# Patient Record
Sex: Male | Born: 1935 | Race: White | Hispanic: No | Marital: Married | Smoking: Never smoker
Health system: Southern US, Community
[De-identification: ages and names within clinical notes are randomized; demographics above are authoritative.]

## PROBLEM LIST (undated history)

## (undated) DIAGNOSIS — Z7901 Long term (current) use of anticoagulants: Secondary | ICD-10-CM

## (undated) DIAGNOSIS — I4891 Unspecified atrial fibrillation: Secondary | ICD-10-CM

## (undated) DIAGNOSIS — Z950 Presence of cardiac pacemaker: Secondary | ICD-10-CM

## (undated) DIAGNOSIS — I2510 Atherosclerotic heart disease of native coronary artery without angina pectoris: Secondary | ICD-10-CM

## (undated) DIAGNOSIS — C8590 Non-Hodgkin lymphoma, unspecified, unspecified site: Secondary | ICD-10-CM

## (undated) HISTORY — PX: TONSILLECTOMY: SUR1361

## (undated) HISTORY — DX: Non-Hodgkin lymphoma, unspecified, unspecified site: C85.90

## (undated) HISTORY — DX: Presence of cardiac pacemaker: Z95.0

## (undated) HISTORY — DX: Unspecified atrial fibrillation: I48.91

## (undated) HISTORY — PX: APPENDECTOMY: SHX54

## (undated) HISTORY — DX: Atherosclerotic heart disease of native coronary artery without angina pectoris: I25.10

## (undated) HISTORY — PX: INSERT / REPLACE / REMOVE PACEMAKER: SUR710

## (undated) HISTORY — PX: CARDIAC CATHETERIZATION: SHX172

## (undated) HISTORY — DX: Long term (current) use of anticoagulants: Z79.01

## (undated) HISTORY — PX: HERNIA REPAIR: SHX51

---

## 1999-08-24 ENCOUNTER — Other Ambulatory Visit: Admission: RE | Admit: 1999-08-24 | Discharge: 1999-08-24 | Payer: Self-pay

## 2001-01-14 ENCOUNTER — Ambulatory Visit (HOSPITAL_COMMUNITY): Admission: RE | Admit: 2001-01-14 | Discharge: 2001-01-14 | Payer: Self-pay | Admitting: Gastroenterology

## 2001-01-14 ENCOUNTER — Encounter (INDEPENDENT_AMBULATORY_CARE_PROVIDER_SITE_OTHER): Payer: Self-pay | Admitting: Specialist

## 2002-06-29 ENCOUNTER — Ambulatory Visit (HOSPITAL_COMMUNITY): Admission: RE | Admit: 2002-06-29 | Discharge: 2002-06-29 | Payer: Self-pay | Admitting: Cardiology

## 2002-06-29 ENCOUNTER — Encounter: Payer: Self-pay | Admitting: Cardiology

## 2002-06-30 ENCOUNTER — Ambulatory Visit (HOSPITAL_COMMUNITY): Admission: RE | Admit: 2002-06-30 | Discharge: 2002-06-30 | Payer: Self-pay | Admitting: Cardiology

## 2003-04-19 ENCOUNTER — Ambulatory Visit (HOSPITAL_COMMUNITY): Admission: RE | Admit: 2003-04-19 | Discharge: 2003-04-19 | Payer: Self-pay | Admitting: Cardiology

## 2003-12-26 ENCOUNTER — Emergency Department (HOSPITAL_COMMUNITY): Admission: EM | Admit: 2003-12-26 | Discharge: 2003-12-26 | Payer: Self-pay | Admitting: Emergency Medicine

## 2006-05-22 ENCOUNTER — Ambulatory Visit: Payer: Self-pay | Admitting: Internal Medicine

## 2006-05-23 ENCOUNTER — Ambulatory Visit: Payer: Self-pay | Admitting: Internal Medicine

## 2006-05-23 ENCOUNTER — Inpatient Hospital Stay (HOSPITAL_COMMUNITY): Admission: RE | Admit: 2006-05-23 | Discharge: 2006-05-25 | Payer: Self-pay | Admitting: Internal Medicine

## 2006-05-24 ENCOUNTER — Encounter: Payer: Self-pay | Admitting: Internal Medicine

## 2006-05-27 ENCOUNTER — Ambulatory Visit: Payer: Self-pay | Admitting: Internal Medicine

## 2006-05-27 ENCOUNTER — Inpatient Hospital Stay (HOSPITAL_COMMUNITY): Admission: EM | Admit: 2006-05-27 | Discharge: 2006-05-30 | Payer: Self-pay | Admitting: Emergency Medicine

## 2006-05-28 ENCOUNTER — Encounter (INDEPENDENT_AMBULATORY_CARE_PROVIDER_SITE_OTHER): Payer: Self-pay | Admitting: Cardiology

## 2006-06-12 ENCOUNTER — Ambulatory Visit: Payer: Self-pay

## 2008-03-31 IMAGING — CR DG CHEST 2V
2 series · 2 of 2 positions shown · non-contrast
Comparison: none

CLINICAL DATA: Pre-pacer.
 CHEST - 2 VIEW:
 No comparison.

[view not recorded (1 of 2)]
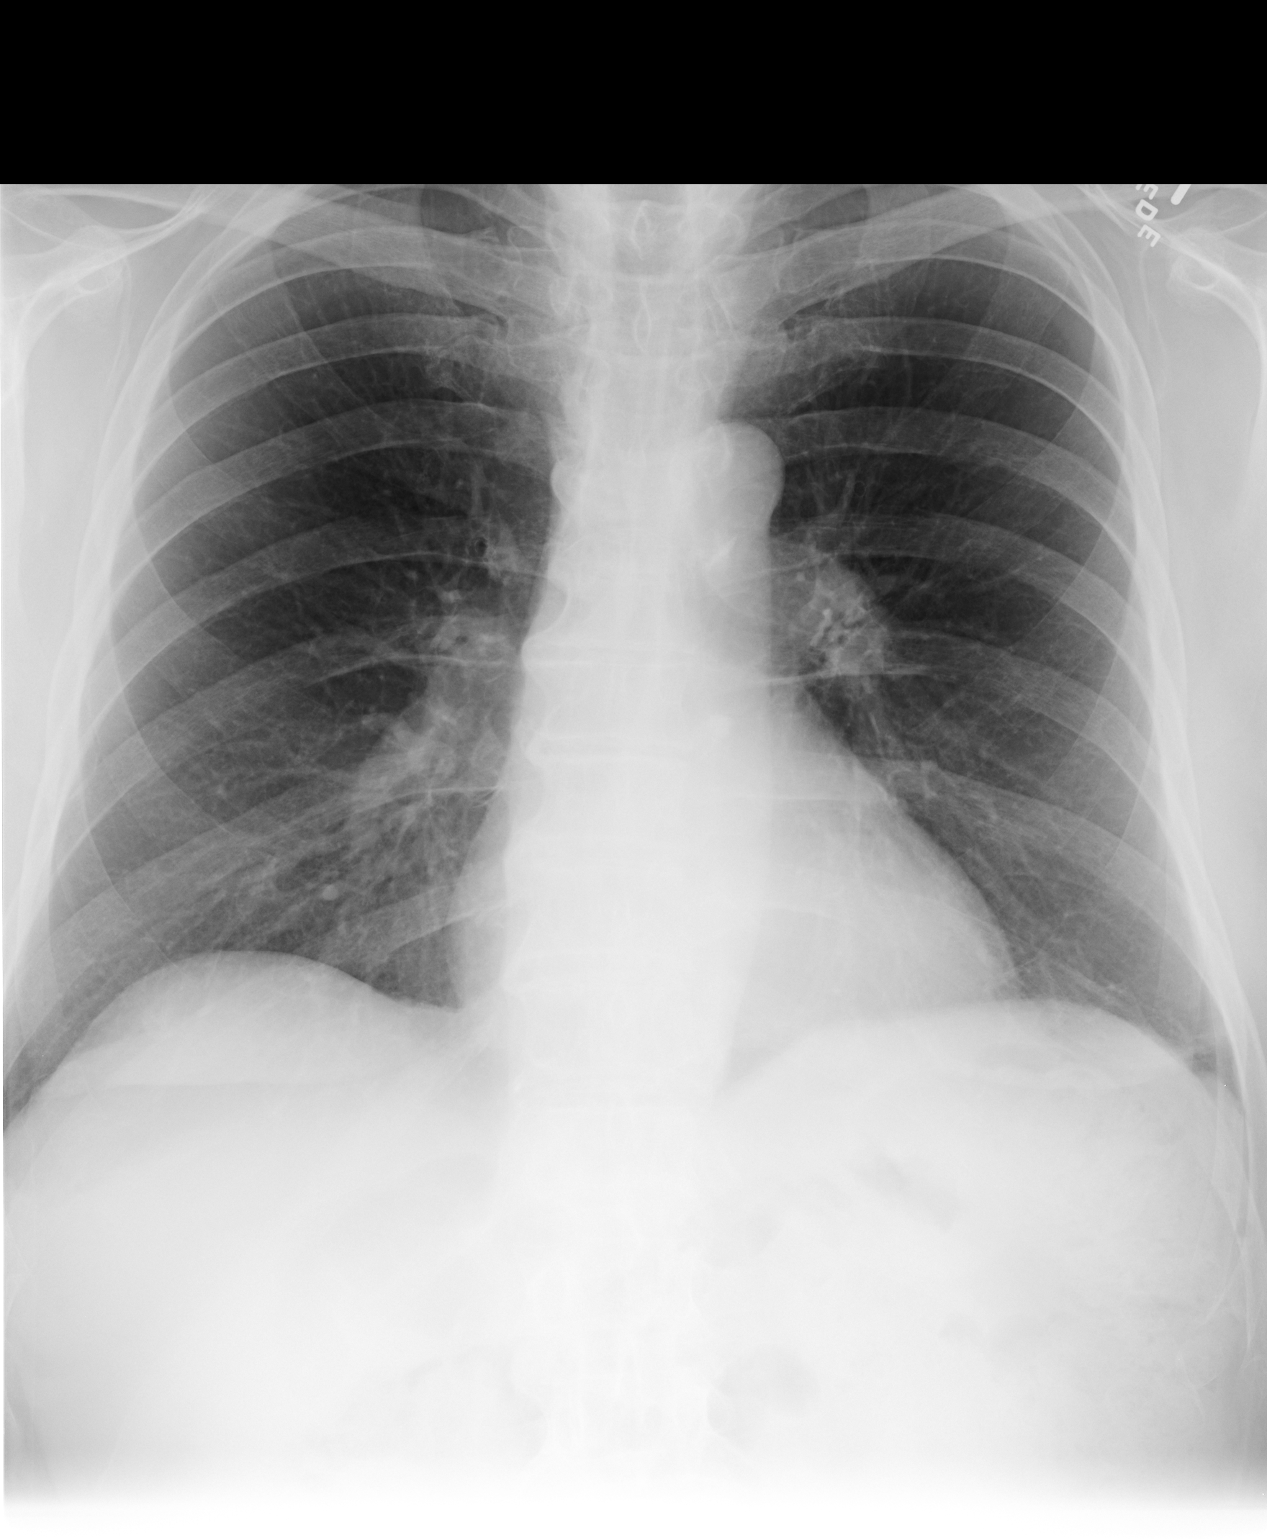

[view not recorded (2 of 2)]
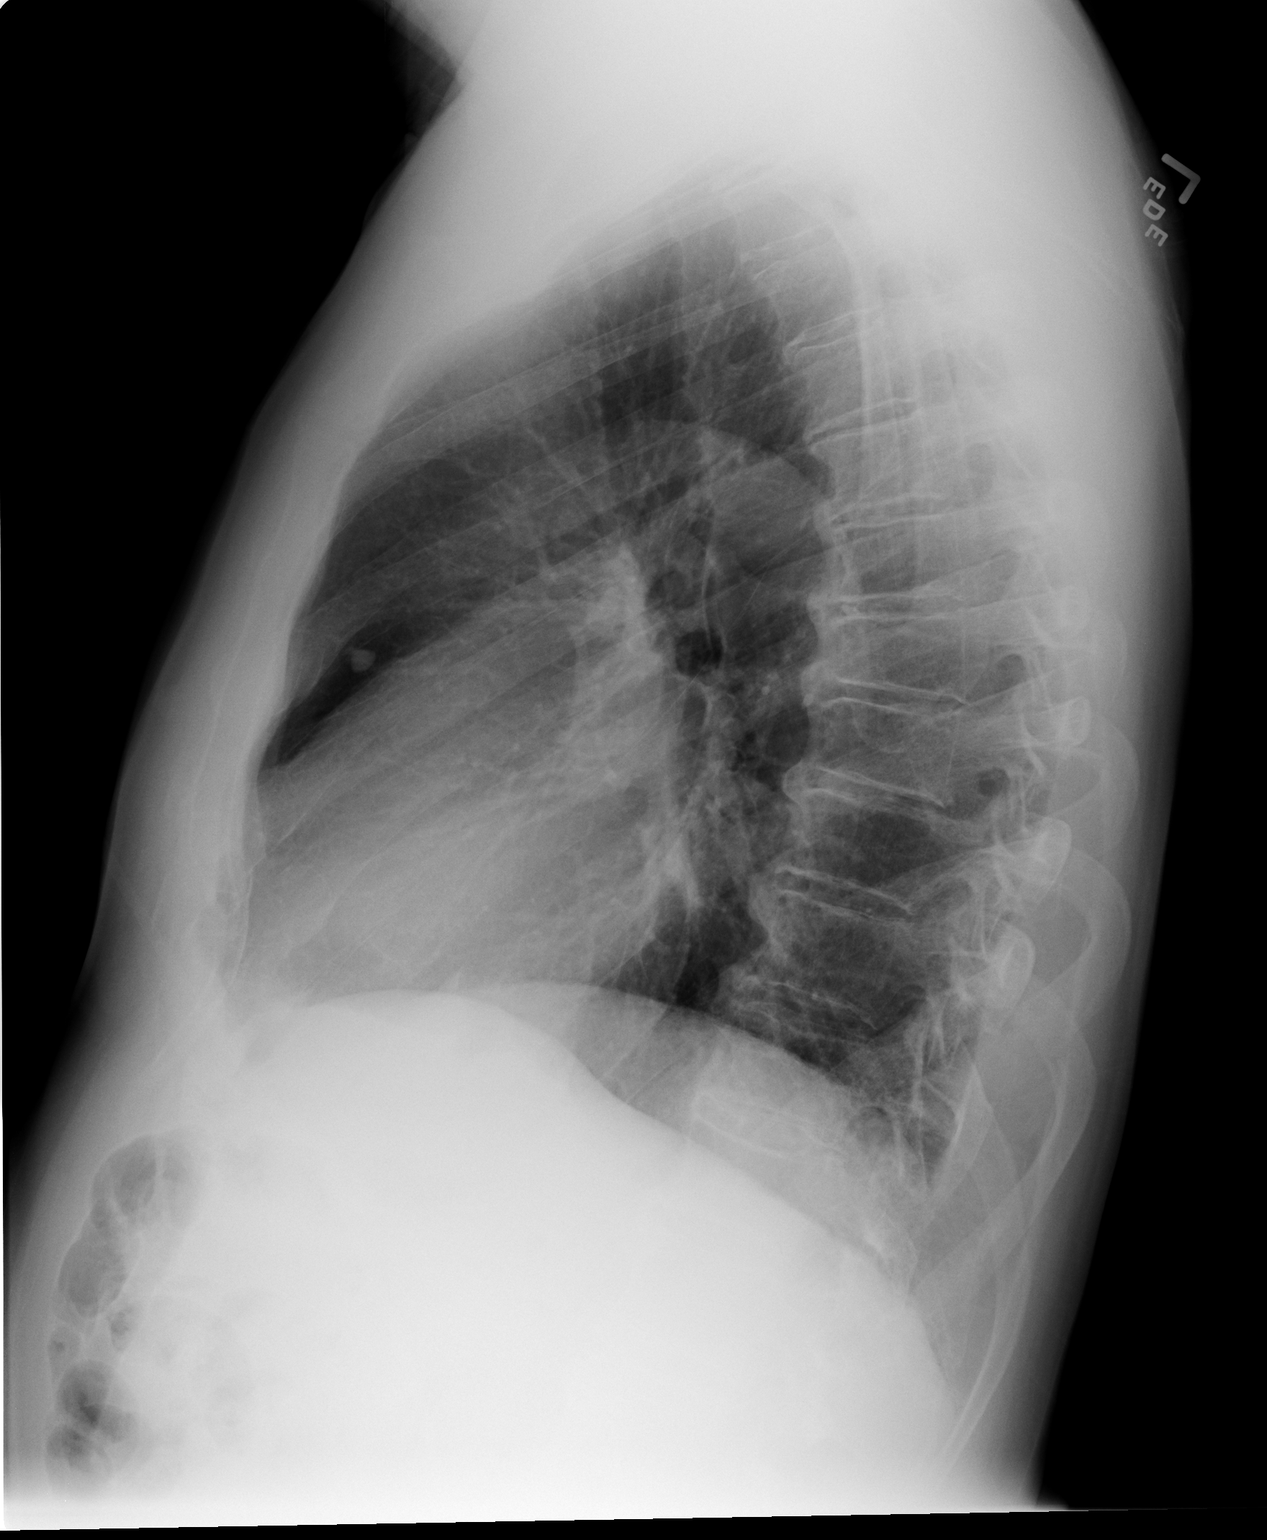

[2 of 2 positions shown; findings below may reference images not displayed]

FINDINGS: The heart size is normal.   There is no heart failure, infiltrate, or effusion.  A calcified granuloma is noted in the right middle lobe.
IMPRESSION: No acute abnormality.

## 2008-09-13 ENCOUNTER — Emergency Department (HOSPITAL_COMMUNITY): Admission: EM | Admit: 2008-09-13 | Discharge: 2008-09-13 | Payer: Self-pay | Admitting: Emergency Medicine

## 2010-10-19 LAB — GLUCOSE, CAPILLARY: Glucose-Capillary: 94 mg/dL (ref 70–99)

## 2010-11-24 NOTE — Cardiovascular Report (Signed)
NAME:  Mark Robinson, Mark Robinson NO.:  [id number]   MEDICAL RECORD NO.:  [id number]                   PATIENT TYPE:  OIB   LOCATION:  2864                                 FACILITY:  [facility]   PHYSICIAN:  W. Ashley Royalty., M.D.         DATE OF BIRTH:  02-14-1930   DATE OF PROCEDURE:  04/19/2003  DATE OF DISCHARGE:                              CARDIAC CATHETERIZATION   HISTORY:  A 77 year old male who has a prior history of atrial fibrillation.  He was taken off of quinidine and placed on Tambocor and developed diffuse  anterior T-wave inversions compatible with ischemia.  He had a history of  irregularity several years ago and was not symptomatic but because of the  new high risk EKG pattern he is brought in at this time for catheterization  to exclude significant proximal left main or left anterior descending  disease.   PROCEDURE:  Left heart catheterization with coronary angiograms and left  ventriculogram.   COMMENTS ABOUT PROCEDURE:  The patient tolerated the procedure well without  difficulty.  The right femoral artery was entered using a single anterior  needle wall stick.  At the end of the procedure good hemostasis and  peripheral pulses were noted.   HEMODYNAMIC DATA:  1. Aorta post contrast 107/53.  2. LV post contrast 107/9-14.   ANGIOGRAPHIC DATA:  1. Left ventriculogram:  Performed in the 30 degree RAO projection.  The     aortic valve was normal.  The mitral valve was normal.  The left     ventricle appears normal in size.  The estimated ejection fraction is 50%     and is lower limits of normal.  Coronary angiograms:  Arise and     distribute normally.  There is calcification noted in the proximal left     anterior descending as well as the right coronary ostium.  2. Left main coronary artery is normal.  3. Left anterior descending:  Calcified with diffuse mild to moderate     narrowing in the proximal vessel and mid vessel.  Two  diagonal branches     arise which contain moderate irregularity.  The distal LAD is tortuous     and somewhat small.  There is calcification with diffuse narrowing in the     mid portion estimated mild to moderate degree.  There is some haziness     just after the diagonal branch which may be due to calcification and     tortuosity.  The stenosis is less than 20-30% at this site.  4. Circumflex coronary artery:  Single marginal artery.  Contains scattered     irregularities but no significant stenoses are noted.  5. Right coronary artery:  Dominant vessel with posterior descending artery     and posterolateral branch.  There is 30-40% narrowing in the distal right     coronary artery involving the ostium in the  posterior descending artery.   IMPRESSION:  1. Mild coronary artery disease with no significant focal obstruction or     stenoses noted.  2. Lower limits of normal ventricular function.    RECOMMENDATIONS:  The patient has a severely abnormal EKG with anterior T-  wave inversions.  Consider stopping Tambocor and see if EKG changes revert.  He will also need to have exercise testing on the Tambocor also for  suppression of atrial fibrillation.                                               Darden Palmer., M.D.    WST/MEDQ  D:  04/19/2003  T:  04/19/2003  Job:  [id number]   cc:   Geoffry Paradise, M.D.  [address]  [place]  [place] [address]  Fax: [phone]

## 2010-11-24 NOTE — Discharge Summary (Signed)
NAME:  MENG, WINTERTON NO.:  [id number]   MEDICAL RECORD NO.:  [id number]          PATIENT TYPE:  INP   LOCATION:  3701                         FACILITY:  [facility]   PHYSICIAN:  Doylene Canning. Ladona Ridgel, MD    DATE OF BIRTH:  01-12-1930   DATE OF ADMISSION:  05/27/2006  DATE OF DISCHARGE:  05/30/2006                               DISCHARGE SUMMARY   PRIMARY DIAGNOSES:  1. Admitted with chest discomfort with associated neck pain.  2. The patient is status post recent implant of permanent pacemaker      for severe bradycardia.  Pacing threshold of the right ventricular      lead is elevated, is a possible post implant pericardial effusion      secondary to microperforation.  3. Echocardiogram May 28, 2006:  Small circumferential      pericardial effusion, ejection fraction 60%, no left ventricular      wall motion abnormalities.  4. Cardiac enzymes negative this admission.  Troponin-I studies were      0.02 and 0.01.  5. The patient is discharging day one status post both atrial and      ventricular lead revisions (probable microperforation of original      right ventricular lead.)  6. Paroxysmal atrial fibrillation/chronic Coumadin.  This been on hold      for 72 hours during this admission and then will be restarted on      the day of discharge.   SECONDARY DIAGNOSES:  1. History of paroxysmal atrial fibrillation.  2. Sinus node dysfunction with severe bradycardia status post      pacemaker implantation.   PROCEDURE:  May 29, 2006 both atrial and right ventricular lead  revisions in pacemaker for probable microperforation probably at the  right ventricular lead.  The patient has had no immediate complications.  Once again Coumadin has been held this admission.   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  He has a history of  paroxysmal atrial fibrillation, severe bradycardia and sinus node  dysfunction.  He had a permanent pacemaker implanted  several days ago.   On the morning of May 27, 2006 he felt sudden onset of chest  discomfort associated with pain radiating to the neck.  He said the pain  is made worse by taking a deep breath.  Also made worse by lying flat,  but better by standing.  Otherwise it is not positional.  There is no  radiation to the left arm.  There is no diaphoresis or nausea.  He did  vomit.  The pain waxes and wanes.  He presented to the emergency room  for additional evaluation.  Impression is his chest pain has both  pleuritic as well as atypical features for angina.  The patient will be  admitted through the emergency room.  Serial cardiac enzymes will be  measured.  2-D echocardiogram will be taken on the morning of May 28, 2006 to compare it to an echo done several days ago after  implantation of pacemaker.  His Coumadin will be held.  He will  be  treated with NSAIDs for pain.  If the pain resolves and enzymes are  negative and the pacemaker checks out well, he will be able to  discharge.  If there is evidence of pericardial effusion secondary to  microperforation or changes in the pacing and sensing parameters, then  atrial and ventricular lead revisions are recommended.   HOSPITAL COURSE:  Mr. Meas presented with chest discomfort to the  emergency room.  His troponin-I studies were cycled; 0.02 then 0.01.  They were essentially negative.  The patient had 2-D echocardiogram on  May 28, 2006 which showed small circumferential pericardial  effusion.  His chest pain waxed and waned during this time.  His  Coumadin has been held this admission.  Interrogation of the device  showed that the right ventricular lead threshold was high.  He was  scheduled for procedure May 29, 2006.  This was done by Dr. Lewayne Bunting.  Both the right atrial and right ventricular leads were  repositioned.  The patient exhibit atrial flutter during the procedure.  The patient discharging May 30, 2006 postprocedure day #1 with  chest pain resolution and restarting Coumadin on that day May 30, 2006.  He goes home on the following medications which have not been  changed from admission.  1. Flecainide 150 mg twice daily.  2. Coumadin as before the admission to the hospital on May 27, 2006.  3. Motrin 400 mg q.8h. as needed.  4. Ambien 10 mg at bedtime daily.   He is to follow-up with Dr. Viann Fish for protime Tuesday June 02, 2006.  He will follow-up with Dr. Lewayne Bunting in 2 weeks.  Dr.  Lubertha Basque office will call with that appointment.  In addition, the  patient discharged on Keflex 500 mg 4 times a day x5 days as this is a  repeat procedure at the pacemaker pocket.   LABORATORY STUDIES THIS ADMISSION:  On admission complete blood count:  White cells 5.5, hemoglobin 14.2, hematocrit 42.2 and platelets are 209.  The D-dimer is 0.39.  Serum electrolytes:  Sodium 142, potassium 3.8,  chloride 105, carbonate 28, glucose of 96, BUN 16, creatinine 0.8.  Once  again troponin-I studies are 0.02 then 0.01.      Maple Mirza, PA      Doylene Canning. Ladona Ridgel, MD  Electronically Signed    GM/MEDQ  D:  07/31/2006  T:  07/31/2006  Job:  [id number]   cc:   Doylene Canning. Ladona Ridgel, MD  Georga Hacking, M.D.

## 2010-11-24 NOTE — Assessment & Plan Note (Signed)
[facility]                         ELECTROPHYSIOLOGY OFFICE NOTE   NAME:Nakagawa, JARAY BOLIVER                         MRN:          [id number]  DATE:06/12/2006                            DOB:          [birth year]    Mr. Joslyn seen in the device clinic today, June 12, 2006 for postop  followup of his revised Medtronic EnRhythm dual-chamber pacemaker  procedure done November 15 by Dr. Graciela Husbands.   Upon exam, the site looks good without redness or swelling. Steri-Strips  were removed without incident. Upon interrogation, battery voltage is  3.11 volts. He is dependent to a rate of 30 in the ventricle at this  point. In the atrium, intrinsic amplitude is 3.4 millivolts with an  impedance of 472 ohms and threshold of 1 volt at 0.4 msec. In the right  ventricle, intrinsic amplitude as stated he is dependent to 30,  impedance is 440 ohms with a threshold of 1.5 volts at 0.4 msec.  Threshold was 1.2 at 0.5 msec at implant. No programming changes were  made at this time. Mr. Leffel will be seen for followup in April and then  return to Dr. Donnie Aho for subsequent followup.      Cleatrice Burke, RN  Electronically Signed      Duke Salvia, MD, [facility]  Electronically Signed   CF/MedQ  DD: 06/12/2006  DT: 06/12/2006  Job #: [id number]

## 2010-11-24 NOTE — H&P (Signed)
NAME:  Mark Robinson, WADDINGTON NO.:  [id number]   MEDICAL RECORD NO.:  [id number]          PATIENT TYPE:  INP   LOCATION:  3729                         FACILITY:  [facility]   PHYSICIAN:  Doylene Canning. Ladona Ridgel, MD    DATE OF BIRTH:  1930/06/07   DATE OF ADMISSION:  05/27/2006  DATE OF DISCHARGE:                                HISTORY & PHYSICAL   CHIEF COMPLAINT:  Chest pain.   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  He  was in his usual state of health, feeling well until today when he noted  sudden onset of chest discomfort associated with pain in the neck.  The  patient states that this pain was made worse by taking a deep breath and  also made worse by lying flat, better by standing.  It was not otherwise  positional.  There was no radiation to left arm.  There was no diaphoresis  or nausea.  He did vomit.  The pain waxed and waned, and he presented to the  emergency department for additional evaluation.   PAST MEDICAL HISTORY:  Is as noted above.  He has a history of chronic  Coumadin therapy.  He is on multiple vitamins.   SOCIAL HISTORY:  The patient is married.  He is a retired Associate Professor.  He  denies tobacco or ethanol abuse.   FAMILY HISTORY:  Was negative for premature coronary disease, otherwise  noncontributory.   REVIEW OF SYSTEMS:  Is negative except as noted in the HPI.  Please note all  systems reviewed and found to be negative.   PHYSICAL EXAMINATION:  He is a pleasant, well-appearing, 77 year old man in  no acute distress.  Blood pressure was 135/70.  The pulse was 60 and  regular.  The respirations were 18, temperature was 98.  HEENT:  Normocephalic.  ENT was normal.  NECK EXAM:  Revealed no jugular  distension.  There were no canon A-waves.  Kussmaul's sign was not present.  The carotids were 2+ and symmetric.  The  thyroid was not appreciably enlarged.  LUNGS:  Clear bilaterally to auscultation.  There were no wheezes, rales or  rhonchi.  There was no increased work of breathing.  CARDIOVASCULAR EXAM:  Revealed a regular rate and rhythm with normal S1-S2.  I do not appreciate a friction rub.  PMI was not enlarged nor was it  laterally displaced.  ABDOMINAL:  Was soft and nontender.  There was no organomegaly.  The bowel  sounds were present.  There was no rebound or guarding.  EXTREMITIES:  Demonstrated no cyanosis, clubbing or edema.  NEUROLOGIC EXAM:  Was alert and oriented x3 with cranial nerves intact.  Strength was 5/5 and symmetric.  EXTREMITIES:  Demonstrate no cyanosis, clubbing or edema.  SKIN EXAM:  Was normal.   Bedside portal echo was performed by myself demonstrating a small/trivial  pericardial  effusion.   IMPRESSION:  1. Chest pain with both pleuritic as well as atypical features for angina.  2. Paroxysmal atrial fibrillation.  3. Sinus bradycardia.  4. Status post pacemaker insertion.  5. Chronic flecainide therapy.  6. Chronic Coumadin therapy.   DISCUSSION:  I have recommended that the patient be admitted to the  hospital.  Will obtain serial cardiac enzymes.  Will repeat his 2-D echo in  the morning and compare it to his echo that was done several days ago after  his pacemaker implantation which was done at that time for nausea and  diaphoresis.  Will check his PT/INR and hold his Coumadin.  Will treat him  with NSAIDs for pain.  If his pain is resolved and his enzymes and negative  and his pacemaker checks out okay, then we will allow him early discharge.  If there is evidence that he has pericardial effusion secondary to  microperforation based on enlarging effusion or based on changes in his  pacing  and sensing parameters, then pericardiocentesis would  be  recommended, along with RV and right atrial lead revisions.      Doylene Canning. Ladona Ridgel, MD  Electronically Signed     GWT/MEDQ  D:  05/27/2006  T:  05/28/2006  Job:  [id number]   cc:   Georga Hacking, M.D.

## 2010-11-24 NOTE — Assessment & Plan Note (Signed)
[facility]                           ELECTROPHYSIOLOGY OFFICE NOTE   NAME:Guerrero, JONPAUL LUMM                         MRN:          [id number]  DATE:05/22/2006                            DOB:          January 03, 1930    HISTORY OF PRESENT ILLNESS:  Mr. Radu is re-referred today by Dr. Viann Fish for evaluation of tachy-brady syndrome and consideration for  permanent pacemaker insertion.  The patient is a very pleasant 77 year old  male with a history of paroxysmal atrial fibrillation and sick sinus  syndrome, for which he has been quite symptomatic recently.  He also has a  history of lymphoma, for which he has received chemotherapy.  The patient  has had fairly good control of his A-fib with flecainide, but has  unfortunately developed bradycardiac with resting heart rates in the 40s and  even into the 30s at night-time.  When he goes out of rhythm, he feels  fatigued and, at times when he is in rhythm, he feels fatigued, thought  secondary to his severe bradycardia.  In the last few weeks, he has been  taking flecainide on a p.r.n. basis because he has, at the same time, had  both A-fib, as well as sinus bradycardia, both of which have been  symptomatic.  The patient has had no frank syncope.   His additional past medical history is as outlined in the HPI.  Also, he has  a history of inguinal hernia repair, has a history of catheterization with  preserved LV function and no coronary disease.   SOCIAL HISTORY:  The patient is a retired Associate Professor.  He denies tobacco  use.  He rarely drinks more than two alcoholic beverages in a day.   FAMILY HISTORY:  Noncontributory.   REVIEW OF SYSTEMS:  Negative, except as noted on the HPI.   PHYSICAL EXAMINATION:  GENERAL:  He is a pleasant, well-appearing, 76-year-  old, in no acute distress.  VITAL SIGNS:  The blood pressure today was 132/74, the pulse 59 and regular,  the respirations were 18, the weight  was 197 pounds.  HEENT EXAM:  Normal.  NECK:  The neck revealed no jugular venous distention.  There is no  thyromegaly.  Trachea is midline.  The carotids are 2+ and symmetric.  LUNGS:  The lungs are clear bilaterally to auscultation.  There are no  wheezes, rales or rhonchi.  There is no increased work of breathing.  CARDIOVASCULAR EXAM:  Showed a regular rate and rhythm with normal S1 and  S2.  There are no murmurs, rubs or gallops appreciated.  The PMI was not  enlarged, nor was it laterally displaced.  ABDOMINAL EXAM:  Soft, nontender, nondistended.  There is no organomegaly.  Bowel sounds are present.  There is no rebound or guarding.  EXTREMITIES:  Demonstrated no cyanosis, clubbing or edema.  The pulses are  2+ and symmetric.  SKIN EXAM:  Normal.  MUSCULOSKELETAL EXAM:  Normal.  NEUROLOGIC EXAM:  Alert and oriented times two with cranial nerves intact.  Strength is 5/5 and symmetric.   The EKG demonstrates sinus  bradycardia with first-degree AV block, which is  quite marked.   IMPRESSION:  1. Symptomatic tachy-brady syndrome.  2. Paroxysmal atrial fibrillation.  3. Chronic Coumadin therapy.   DISCUSSION:  Overall, Mr. Freeland is stable, but he clearly has symptomatic  tachy-brady.  For this reason, I have recommended permanent pacemaker  insertion.  The risks and benefits, goals and expectations of the procedure  have been discussed with him and he would like to proceed at the earliest  possible convenient time.  This was scheduled in that regard.     Doylene Canning. Ladona Ridgel, MD  Electronically Signed    GWT/MedQ  DD: 05/22/2006  DT: 05/22/2006  Job #: [id number]   cc:   Geoffry Paradise, M.D.

## 2010-11-24 NOTE — Discharge Summary (Signed)
NAME:  Mark Robinson, Mark Robinson NO.:  [id number]   MEDICAL RECORD NO.:  [id number]          PATIENT TYPE:  OIB   LOCATION:  2021                         FACILITY:  [facility]   PHYSICIAN:  Doylene Canning. Ladona Ridgel, MD    DATE OF BIRTH:  1930-04-12   DATE OF ADMISSION:  05/23/2006  DATE OF DISCHARGE:  05/24/2006                                 DISCHARGE SUMMARY   ALLERGIES:  This patient has no known drug allergies.   PRINCIPAL DIAGNOSES:  1. Discharging day #1, status post implantation of Medtronic EnRhytm dual-      chamber pacemaker.  2. Sick sinus syndrome and tachybrady syndrome.  3. Vagal reaction that x-ray secondary to hypovolemia. The patient's      symptoms cleared with IV bolus challenge and IV drip.  The patient was      feeling better by mid morning.  His pacemaker was also interrogated and      changed to a DDD at 50 with AV delay of 350.  There is a possibility      that he would be switched back to AAI or DDD.  4. The patient need a flecainide trough level.   SECONDARY DIAGNOSES:  1. Symptomatic paroxysmal atrial fibrillation.  2. Fatigue with atrial fibrillation and also bradycardia with      fibrillation.  3. Chronic Coumadin therapy.   PROCEDURE:  Implantation of Medtronic dual-chamber pacemaker by Dr. Lewayne Bunting.   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  He is being considered for pacemaker implantation.  He has a history of paroxysmal atrial fibrillation and sick sinus syndrome.  These have been quite symptomatic lately. He has a history of lymphoma for  which he has received chemotherapy.  The patient has had a fairly good  control of his atrial fibrillation with flecainide but unfortunately has  developed resting bradycardia.   When the patient goes rhythm and is fibrillation, he feels fatigued; and  even in rhythm, he feels fatigued. This is thought secondary to bradycardia.  For the past few  weeks he has been taking flecainide on a p.r.n. basis  because he has both atrial fibrillation and sinus bradycardia, both of which  are symptomatic. The patient has no frank syncope.   HOSPITAL COURSE:  The patient presented electively on November 15, underwent  implantation of the Medtronic device without complication.  However, on  postprocedure day #1, when presenting to x-ray, he had a vagal reaction  secondary to hypokalemia. This was easily corrected with a fluid bolus of  normal saline. There was some question that the pacemaker was  inappropriately sensing, and this was reprogrammed. The patient's AV delay  was placed at 350. The pacemaker itself is a DDD 50.  The patient is asked  not to drive for the next week, not to lift anything heavier than 10 pounds  for the next 4 weeks.  He is to keep his incision dry for the next 7 days  and to sponge bathe until Thursday, November 22.   DISCHARGE  MEDICATIONS:  1. Flecainide 150 mg twice daily.  2. Coumadin as before this admission.  3. Multivitamin daily.  4. Ambien/Lunesta at bedtime as needed.   FOLLOW-UP APPOINTMENTS:  1. To Dr. York Spaniel office Monday November 19.  He is go in the morning and      to get a flecainide trough study before he takes his first dose of      flecainide Monday morning.  2. Pacer clinic at [facility] at  [address],      Monday, November 26, at 9:20  3. See Dr. Donnie Aho on Tuesday, November 27, at 3:15, and he will see Dr.      Ladona Ridgel at [facility] on September 24, 2006, at 9:20 in the morning.   LABORATORY STUDIES PERTINENT TO THIS ADMISSION:  Complete blood count taken  November 14: White cells 4.6, hemoglobin 14.9, hematocrit 44.8, platelets  235. The pro time was 19.8, INR 2.4. Sodium 140, potassium 4.1, chloride  104, carbonate 30, glucose 106, BUN 15, creatinine 1.1. A Pro time on the  day of discharge 19.6, INR 1.6.  I will check before discharge to make sure  that his  low INR will not keep him or require a Lovenox bridge.      Maple Mirza, PA      Doylene Canning. Ladona Ridgel, MD  Electronically Signed    GM/MEDQ  D:  05/24/2006  T:  05/24/2006  Job:  [id number]   cc:   Georga Hacking, M.D.

## 2010-11-24 NOTE — Op Note (Signed)
NAME:  Mark Robinson, Mark Robinson NO.:  [id number]   MEDICAL RECORD NO.:  [id number]          PATIENT TYPE:  INP   LOCATION:  3701                         FACILITY:  [facility]   PHYSICIAN:  Doylene Canning. Ladona Ridgel, MD    DATE OF BIRTH:  Nov 04, 1929   DATE OF PROCEDURE:  05/29/2006  DATE OF DISCHARGE:                                 OPERATIVE REPORT   PROCEDURE PERFORMED:  Atrial and ventricular lead revision.   INDICATIONS:  Status post pacemaker with indication of a microscopic  perforation probably the RV lead.   INTRODUCTION:  The patient is a 77 year old male with a history of  symptomatic tachy-brady syndrome and paroxysmal/persistent A fib on  flecainide.  The patient underwent pacemaker implantation secondary to  severe bradycardia several days ago and he represented to hospital with  pleuritic chest pain and a new pericardial effusion.  Pacing threshold of  the RV lead is also increased.  With all the above the decision was made to  proceed with lead revision with concern that increase in his RV pacing  threshold that there was microscopic perforation and with his need for  chronic Coumadin would result in continued bleeding in the pericardial space  if it was not revised.   PROCEDURE:  After informed consent was obtained, the patient was taken  diagnostic EP lab in fasting state.  After usual preparation, draping,  intravenous fentanyl Midazolam was given for sedation.  30 mL of lidocaine  was infiltrated in the left infraclavicular region.  A 5 cm incision was  carried out over this region.  Electrocautery utilized to dissect down to  the fascial plane.  The pocket was irrigated with kanamycin.  The leads were  freed up from their silk suture and the atrial lead was initially evaluated.  A 52-cm stylet was advanced into the atrial lead and the helix was retracted  freeing the lead up from its endocardial connection.  Mapping was then  carried out again with the J stylet and  at the final site the flutter waves  measured between 2 and 4 mV and the threshold was less than 2 volts in the  atrium.  It should be noted that the termination of atrial capture was more  difficult secondary to the patient's underlying atrial flutter.  Attempts to  pace terminate the atrial flutter were unsuccessful.  At this point, the  lead was actively fixed.  Attention was then turned to the RV lead.  The  helix was again retracted and the lead was removed from its RV apical  position and mapping was then carried out being directed towards the RV  septum.  The R-waves measured on the RV septum between 8 and 9 mV with the  lead actively fixed and the threshold was approximate 1.2 volts at 0.5  milliseconds.  The lead was actively fixed in this location and 10 volts  pacing did not stimulate diaphragm.  There was injury current present.  Again the atrial ventricular leads were then secured to subpectoralis fascia  and a silk suture was also utilized and  with silk suture and the sewing  sleeves were also secured with silk suture.  The previously implanted pocket  was irrigated and the Medtronic dual-chamber pacemaker was reconnected to  the atrial and ventricular pacing leads and placed back in the subcutaneous  pocket.  The generator secured with silk suture and additional kanamycin was  utilized to irrigate the pocket.  The incision was closed with a layer of 2-  0 Vicryl followed by layer of 3-0 Vicryl followed by layer of 4-0 Vicryl.  Benzoin was painted on the skin and Steri-Strips were applied and pressure  dressing was placed.  The patient was returned to his room in satisfactory  condition.   COMPLICATIONS:  There were no immediate procedure complications.   RESULTS:  This demonstrates successful atrial and ventricular lead revision  in a patient with prior pacemaker implantation complicated by a microscopic  perforation and small pericardial effusion with subsequent pleuritic  chest  pain.      Doylene Canning. Ladona Ridgel, MD  Electronically Signed     GWT/MEDQ  D:  05/29/2006  T:  05/29/2006  Job:  [id number]   cc:   Georga Hacking, M.D.

## 2010-11-24 NOTE — Op Note (Signed)
NAME:  JANUEL, DOOLAN NO.:  [id number]   MEDICAL RECORD NO.:  [id number]          PATIENT TYPE:  OIB   LOCATION:  2807                         FACILITY:  [facility]   PHYSICIAN:  Doylene Canning. Ladona Ridgel, MD    DATE OF BIRTH:  May 27, 1930   DATE OF PROCEDURE:  05/23/2006  DATE OF DISCHARGE:                                 OPERATIVE REPORT   PROCEDURE PERFORMED:  Permanent pacemaker implantation.   SURGEON:  Doylene Canning. Ladona Ridgel, MD   INDICATIONS:  Symptomatic tachy-brady syndrome with paroxysmal atrial  fibrillation.   I. INTRODUCTION:  The patient is a 77 year old male with a history of sinus  bradycardia as well as paroxysmal atrial fibrillation and flutter who has  been on flecainide, but unfortunately develops symptomatic bradycardia on  this drug.  Even when he is off flecainide, he has heart rates in the 30s  and 40s.  He is now referred for permanent pacemaker insertion.   II. PROCEDURE:  After informed consent was obtained, the patient was taken  to the diagnostic catheterization lab in the fasting state.  After the usual  preparation and draping, intravenous fentanyl and midazolam were given for  sedation.  Thirty milliliters of lidocaine were infiltrated in the left  infraclavicular region.  A 5-cm incision was carried out over this region  and electrocautery utilized to dissect down to the fascial plane.  The left  subclavian vein was punctured x2 after 10 mL of contrast were injected into  the left upper extremity demonstrated the vein to be patent.  The Medtronic  model 5076 58-cm active-fixation pacing lead, serial number [id number], was  advanced to the right ventricle and the Medtronic model 5076 52-cm active-  fixation pacing lead, serial number [id number], was advanced to the right  atrium.  Mapping was carried out in the right ventricle.  It should be noted  that R waves were decreased throughout.  The ventricular lead was placed in  multiple spots;  unfortunately the R waves quickly decreased with each  location.  Finally at the third site near the RV apex, R waves measured 11  before going down to 8, where they remained stable.  The pacing impedance  was 693 ohms, the pacing threshold 0.6 volts at 0.5 milliseconds and 10-volt  pacing did not stimulate the diaphragm in this location with the lead  actively affixed.  With the ventricular lead in satisfactory position,  attention was then turned placement of the atrial lead, which was placed in  the anterolateral portion of the right atrium, where P waves were 5 and the  pacing impedance 682 ohms with a threshold of 0.6 volts at 0.5 milliseconds  and again, 10-volt pacing did not stimulate the diaphragm with the lead  actively affixed.  With these satisfactory parameters, the lead was secured  to the subpectoralis fascia with a figure-of-eight silk suture.  The sew-in  sleeve was also secured with a silk suture.  Electrocautery was utilized to  make a subcutaneous pocket.  Kanamycin irrigation was utilized to irrigate  the pocket and electrocautery utilized to assure  hemostasis.  The Medtronic  EnRhythm model [id number] dual-chamber pacemaker, serial number [id number], was  connected to the atrial and the ventricular leads and placed in the  subcutaneous pocket.  Generator was secured with a silk suture.  Additional  kanamycin was utilized to irrigate the pocket and electrocautery utilized to  assure hemostasis.  The incision was then closed with a layer of 2-0 Vicryl  followed by a layer of 3-0 Vicryl followed by a layer of 4-0 Vicryl.  Benzoin was painted on the skin, Steri-Strips were applied and a pressure  dressing was placed and the patient was returned to his room in satisfactory  condition.   III. COMPLICATIONS:  There were no immediate procedure complications.   IV. RESULTS:  This demonstrate successful implantation of a Medtronic dual-  chamber pacemaker in a patient with  symptomatic bradycardia and intermittent  atrial fibrillation.      Doylene Canning. Ladona Ridgel, MD  Electronically Signed     GWT/MEDQ  D:  05/23/2006  T:  05/24/2006  Job:  [id number]   cc:   Georga Hacking, M.D.  Geoffry Paradise, M.D.

## 2011-05-22 ENCOUNTER — Other Ambulatory Visit: Payer: Self-pay | Admitting: Orthopedic Surgery

## 2011-05-22 ENCOUNTER — Encounter (HOSPITAL_BASED_OUTPATIENT_CLINIC_OR_DEPARTMENT_OTHER)
Admission: RE | Admit: 2011-05-22 | Discharge: 2011-05-22 | Disposition: A | Discharge status: Home / Self Care | Payer: Medicare Other | Source: Ambulatory Visit | Attending: Orthopedic Surgery | Admitting: Orthopedic Surgery

## 2011-05-22 ENCOUNTER — Encounter (HOSPITAL_BASED_OUTPATIENT_CLINIC_OR_DEPARTMENT_OTHER): Payer: Self-pay | Admitting: *Deleted

## 2011-05-22 LAB — BASIC METABOLIC PANEL
Calcium: 9.1 mg/dL (ref 8.4–10.5)
GFR calc Af Amer: >90 mL/min (ref >90)
GFR calc non Af Amer: 78 mL/min — ABNORMAL LOW (ref >90)
Potassium: 4.3 mEq/L (ref 3.5–5.1)
Sodium: 141 mEq/L (ref 135–145)

## 2011-05-22 LAB — APTT: aPTT: 24 seconds (ref 24–37)

## 2011-05-22 LAB — PROTIME-INR
INR: 1.25 (ref 0.00–1.49)
Prothrombin Time: 16 seconds — ABNORMAL HIGH (ref 11.6–15.2)

## 2011-05-22 NOTE — Progress Notes (Signed)
To come in for cxr-pt ptt,bmet-coumadin held per pt since 05/18/11

## 2011-05-22 NOTE — Progress Notes (Signed)
Dr Gypsy Balsam waved chest x ray

## 2011-05-23 ENCOUNTER — Encounter (HOSPITAL_BASED_OUTPATIENT_CLINIC_OR_DEPARTMENT_OTHER)
Admission: RE | Disposition: A | Discharge status: Home / Self Care | Payer: Self-pay | Source: Ambulatory Visit | Attending: Orthopedic Surgery

## 2011-05-23 ENCOUNTER — Encounter (HOSPITAL_BASED_OUTPATIENT_CLINIC_OR_DEPARTMENT_OTHER): Payer: Self-pay | Admitting: *Deleted

## 2011-05-23 ENCOUNTER — Ambulatory Visit (HOSPITAL_BASED_OUTPATIENT_CLINIC_OR_DEPARTMENT_OTHER)
Admission: RE | Admit: 2011-05-23 | Discharge: 2011-05-23 | Disposition: A | Discharge status: Home / Self Care | Payer: Medicare Other | Source: Ambulatory Visit | Attending: Orthopedic Surgery | Admitting: Orthopedic Surgery

## 2011-05-23 ENCOUNTER — Encounter (HOSPITAL_BASED_OUTPATIENT_CLINIC_OR_DEPARTMENT_OTHER): Payer: Self-pay | Admitting: Certified Registered Nurse Anesthetist

## 2011-05-23 ENCOUNTER — Ambulatory Visit (HOSPITAL_BASED_OUTPATIENT_CLINIC_OR_DEPARTMENT_OTHER): Payer: Medicare Other | Admitting: Certified Registered Nurse Anesthetist

## 2011-05-23 DIAGNOSIS — X58XXXA Exposure to other specified factors, initial encounter: Secondary | ICD-10-CM | POA: Insufficient documentation

## 2011-05-23 DIAGNOSIS — Z01812 Encounter for preprocedural laboratory examination: Secondary | ICD-10-CM | POA: Insufficient documentation

## 2011-05-23 DIAGNOSIS — S60459A Superficial foreign body of unspecified finger, initial encounter: Secondary | ICD-10-CM | POA: Insufficient documentation

## 2011-05-23 DIAGNOSIS — Z950 Presence of cardiac pacemaker: Secondary | ICD-10-CM | POA: Insufficient documentation

## 2011-05-23 HISTORY — PX: FOREIGN BODY REMOVAL: SHX962

## 2011-05-23 HISTORY — DX: Presence of cardiac pacemaker: Z95.0

## 2011-05-23 HISTORY — PX: LESION REMOVAL: SHX5196

## 2011-05-23 SURGERY — FOREIGN BODY REMOVAL ADULT
Anesthesia: Regional | Laterality: Left

## 2011-05-23 MED ORDER — ONDANSETRON HCL 4 MG/2ML IJ SOLN: 4.0000 mg | Freq: Once | INTRAMUSCULAR | Status: DC | PRN

## 2011-05-23 MED ORDER — CEFAZOLIN SODIUM 1-5 GM-% IV SOLN
INTRAVENOUS | Status: DC | PRN
  Administered 2011-05-23: 1 g via INTRAVENOUS

## 2011-05-23 MED ORDER — HYDROMORPHONE HCL PF 1 MG/ML IJ SOLN: 0.2500 mg | INTRAMUSCULAR | Status: DC | PRN

## 2011-05-23 MED ORDER — CHLORHEXIDINE GLUCONATE 4 % EX LIQD: 60.0000 mL | Freq: Once | CUTANEOUS | Status: DC

## 2011-05-23 MED ORDER — ONDANSETRON HCL 4 MG/2ML IJ SOLN
INTRAMUSCULAR | Status: DC | PRN
  Administered 2011-05-23: 4 mg via INTRAVENOUS

## 2011-05-23 MED ORDER — LACTATED RINGERS IV SOLN
INTRAVENOUS | Status: DC
  Administered 2011-05-23: 13:00:00 via INTRAVENOUS

## 2011-05-23 MED ORDER — MORPHINE SULFATE 2 MG/ML IJ SOLN: 0.0500 mg/kg | INTRAMUSCULAR | Status: DC | PRN

## 2011-05-23 MED ORDER — HYDROCODONE-ACETAMINOPHEN 5-500 MG PO TABS: 1.0000 | ORAL_TABLET | Freq: Four times a day (QID) | ORAL | Status: AC | PRN

## 2011-05-23 MED ORDER — PROPOFOL 10 MG/ML IV EMUL
INTRAVENOUS | Status: DC | PRN
  Administered 2011-05-23: 75 ug/kg/min via INTRAVENOUS

## 2011-05-23 MED ORDER — MEPERIDINE HCL 25 MG/ML IJ SOLN: 6.2500 mg | INTRAMUSCULAR | Status: DC | PRN

## 2011-05-23 MED ORDER — FENTANYL CITRATE 0.05 MG/ML IJ SOLN
INTRAMUSCULAR | Status: DC | PRN
  Administered 2011-05-23: 25 ug via INTRAVENOUS
  Administered 2011-05-23: 50 ug via INTRAVENOUS

## 2011-05-23 MED ORDER — DEXAMETHASONE SODIUM PHOSPHATE 10 MG/ML IJ SOLN
INTRAMUSCULAR | Status: DC | PRN
  Administered 2011-05-23: 10 mg via INTRAVENOUS

## 2011-05-23 MED ORDER — BUPIVACAINE HCL (PF) 0.25 % IJ SOLN
INTRAMUSCULAR | Status: DC | PRN
  Administered 2011-05-23: 6 mL

## 2011-05-23 SURGICAL SUPPLY — 35 items
BLADE MINI RND TIP GREEN BEAV (BLADE) IMPLANT
BLADE SURG 15 STRL LF DISP TIS (BLADE) ×1 IMPLANT
BLADE SURG 15 STRL SS (BLADE) ×1
BNDG COHESIVE 1X5 TAN STRL LF (GAUZE/BANDAGES/DRESSINGS) ×2 IMPLANT
BNDG ESMARK 4X9 LF (GAUZE/BANDAGES/DRESSINGS) IMPLANT
CHLORAPREP W/TINT 26ML (MISCELLANEOUS) ×2 IMPLANT
CLOTH BEACON ORANGE TIMEOUT ST (SAFETY) ×2 IMPLANT
CORDS BIPOLAR (ELECTRODE) ×2 IMPLANT
COVER MAYO STAND STRL (DRAPES) ×2 IMPLANT
COVER TABLE BACK 60X90 (DRAPES) ×2 IMPLANT
CUFF TOURNIQUET SINGLE 18IN (TOURNIQUET CUFF) ×2 IMPLANT
DRAPE EXTREMITY T 121X128X90 (DRAPE) ×2 IMPLANT
DRAPE OEC MINIVIEW 54X84 (DRAPES) ×2 IMPLANT
DRAPE SURG 17X23 STRL (DRAPES) ×2 IMPLANT
GAUZE XEROFORM 1X8 LF (GAUZE/BANDAGES/DRESSINGS) ×2 IMPLANT
GLOVE BIO SURGEON STRL SZ 6.5 (GLOVE) ×2 IMPLANT
GLOVE BIO SURGEON STRL SZ7.5 (GLOVE) ×2 IMPLANT
GLOVE SURG ORTHO 8.0 STRL STRW (GLOVE) ×2 IMPLANT
GOWN BRE IMP PREV XXLGXLNG (GOWN DISPOSABLE) ×4 IMPLANT
GOWN PREVENTION PLUS XLARGE (GOWN DISPOSABLE) ×2 IMPLANT
NEEDLE 27GAX1X1/2 (NEEDLE) IMPLANT
NS IRRIG 1000ML POUR BTL (IV SOLUTION) ×2 IMPLANT
PACK BASIN DAY SURGERY FS (CUSTOM PROCEDURE TRAY) ×2 IMPLANT
PADDING CAST ABS 4INX4YD NS (CAST SUPPLIES) ×1
PADDING CAST ABS COTTON 4X4 ST (CAST SUPPLIES) ×1 IMPLANT
SPONGE GAUZE 4X4 12PLY (GAUZE/BANDAGES/DRESSINGS) ×2 IMPLANT
STOCKINETTE 4X48 STRL (DRAPES) ×2 IMPLANT
SUT VIC AB 4-0 P2 18 (SUTURE) IMPLANT
SUT VICRYL RAPID 5 0 P 3 (SUTURE) IMPLANT
SUT VICRYL RAPIDE 4/0 PS 2 (SUTURE) ×2 IMPLANT
SYR BULB 3OZ (MISCELLANEOUS) ×2 IMPLANT
SYR CONTROL 10ML LL (SYRINGE) IMPLANT
TOWEL OR 17X24 6PK STRL BLUE (TOWEL DISPOSABLE) ×4 IMPLANT
UNDERPAD 30X30 INCONTINENT (UNDERPADS AND DIAPERS) ×2 IMPLANT
WATER STERILE IRR 1000ML POUR (IV SOLUTION) ×2 IMPLANT

## 2011-05-23 NOTE — Op Note (Signed)
Dictated [id number]

## 2011-05-23 NOTE — Transfer of Care (Signed)
Immediate Anesthesia Transfer of Care Note  Patient: Mark Robinson  Procedure(s) Performed:  FOREIGN BODY REMOVAL ADULT - left index finger; LESION REMOVAL - excision open lesion left index finger  Patient Location: PACU  Anesthesia Type: Bier block  Level of Consciousness: awake and oriented  Airway & Oxygen Therapy: Patient Spontanous Breathing and Patient connected to face mask oxygen  Post-op Assessment: Report given to PACU RN and Post -op Vital signs reviewed and stable  Post vital signs: Reviewed and stable  Complications: No apparent anesthesia complications

## 2011-05-23 NOTE — Brief Op Note (Signed)
05/23/2011  4:09 PM  PATIENT:  Remus Loffler  77 y.o. male  PRE-OPERATIVE DIAGNOSIS:  foreign body left index  POST-OPERATIVE DIAGNOSIS:  Foreign Body Left Index  PROCEDURE:  Procedure(s): FOREIGN BODY REMOVAL ADULT LESION REMOVAL  SURGEON:  Surgeon(s): Nicki Reaper, MD Tami Ribas  PHYSICIAN ASSISTANT:   ASSISTANTS: none   ANESTHESIA:   local and regional  EBL:  Total I/O In: 900 [I.V.:900] Out: -   BLOOD ADMINISTERED:none  DRAINS: none   LOCAL MEDICATIONS USED:  MARCAINE 5CC  SPECIMEN:  No Specimen  DISPOSITION OF SPECIMEN:  N/A  COUNTS:  YES  TOURNIQUET:   Total Tourniquet Time Documented: area (laterality) - 29 minutes  DICTATION: .Other Dictation: Dictation Number [id number]  PLAN OF CARE: Discharge to home after PACU  PATIENT DISPOSITION:  PACU - hemodynamically stable.

## 2011-05-23 NOTE — Op Note (Signed)
NAME:  Mark Robinson, Mark Robinson NO.:  MEDICAL RECORD NO.:  [id number]  LOCATION:                                 FACILITY:  PHYSICIAN:  Cindee Salt, M.D.            DATE OF BIRTH:  DATE OF PROCEDURE: DATE OF DISCHARGE:                              OPERATIVE REPORT   PREOPERATIVE DIAGNOSIS:  Foreign body with open lesion left index finger.  POSTOPERATIVE DIAGNOSIS:  Foreign body with open lesion left index finger.  OPERATION:  Excision of tract with removal of foreign body, left index finger.  SURGEON:  Cindee Salt, MD  PROCEDURE:  The patient was brought to the operating room where a forearm IV regional anesthetic was carried out without difficulty.  He was prepped and draped using ChloraPrep, in supine position, left arm free, a 3 minute dry time was allowed.  Time-out taken confirming the patient and procedure.  The entrance wound was excised with image intensification.  The location of the foreign body was noted.  An incision was then made on the ulnar aspect of the digit.  The open area being on the radial aspect.  With blunt dissection, the dissection was carried down to the bone.  The foreign body was immediately apparent. This appeared to be at the tip of the needle.  This was removed. The area debrided and irrigated and each wound was closed with interrupted 5- 0 Vicryl repeat sutures.  A sterile compressive dressing was applied after a metacarpal block was given with 0.25% Marcaine without epinephrine 5 mL was used.  He tolerated the procedure well and was taken to the recovery room for observation in satisfactory condition. He will be discharged to home to return to the [facility] [place] in 1 week on Vicodin.          ______________________________ Cindee Salt, M.D.     GK/MEDQ  D:  05/23/2011  T:  05/23/2011  Job:  [id number]

## 2011-05-23 NOTE — H&P (Signed)
Mark Robinson is a 77 year old right hand dominant male who comes in complaining of an injury to his left index finger. This occurred 3 weeks ago when he was working on a water pump on his boat. He suffered a puncture wound. He thinks there may be something left behind. He has been on Coumadin. He has been soaking this. He complains of occasional mild sharp pain with a feeling of swelling. He states it has gotten worse. Activity makes it worse. He applied Neosporin. He has no prior history of injuries. INR generally runs between 2 and 3. No history of diabetes, thyroid problems, arthritis or gout. Mark Robinson is an 77 y.o. male.   Chief Complaint: FB ilf See above HPI: see above  Past Medical History  Diagnosis Date  . Arthritis   . Cardiac abnormality 2007    Atrial fib-pacemaker  . Pacemaker     Past Surgical History  Procedure Date  . Tonsillectomy   . Colon surgery     colonoscopy  . Cardiac catheterization   . Appendectomy   . Insert / replace / remove pacemaker   . Hernia repair     History reviewed. No pertinent family history. Social History:  reports that he has never smoked. He does not have any smokeless tobacco history on file. He reports that he drinks alcohol. He reports that he does not use illicit drugs.  Allergies: No Known Allergies  Medications Prior to Admission  Medication Dose Route Frequency Provider Last Rate Last Dose  . lactated ringers infusion   Intravenous Continuous Bedelia Person, MD 20 mL/hr at 05/23/11 1310     Medications Prior to Admission  Medication Sig Dispense Refill  . flecainide (TAMBOCOR) 150 MG tablet Take 150 mg by mouth 2 (two) times daily.        Marland Kitchen warfarin (COUMADIN) 5 MG tablet Take 5 mg by mouth daily.          Results for orders placed during the hospital encounter of 05/23/11 (from the past 48 hour(s))  BASIC METABOLIC PANEL     Status: Abnormal   Collection Time   05/22/11 11:30 AM      Component Value Range Comment   Sodium  141  135 - 145 (mEq/L)    Potassium 4.3  3.5 - 5.1 (mEq/L)    Chloride 106  96 - 112 (mEq/L)    CO2 25  19 - 32 (mEq/L)    Glucose, Bld 92  70 - 99 (mg/dL)    BUN 19  6 - 23 (mg/dL)    Creatinine, Ser 1.61  0.50 - 1.35 (mg/dL)    Calcium 9.1  8.4 - 10.5 (mg/dL)    GFR calc non Af Amer 78 (*) >90 (mL/min)    GFR calc Af Amer >90  >90 (mL/min)   PROTIME-INR     Status: Abnormal   Collection Time   05/22/11 11:30 AM      Component Value Range Comment   Prothrombin Time 16.0 (*) 11.6 - 15.2 (seconds)    INR 1.25  0.00 - 1.49    APTT     Status: Normal   Collection Time   05/22/11 11:30 AM      Component Value Range Comment   aPTT 24  24 - 37 (seconds)   POCT HEMOGLOBIN-HEMACUE     Status: Abnormal   Collection Time   05/23/11  1:17 PM      Component Value Range Comment   Hemoglobin 11.5 (*)  13.0 - 17.0 (g/dL)     No results found.   A comprehensive review of systems was negative.  Blood pressure 122/75, pulse 68, temperature 98 F (36.7 C), temperature source Oral, resp. rate 16, height 5\' 11"  (1.803 m), weight 81.647 kg (180 lb), SpO2 96.00%.  General appearance: alert, cooperative and appears stated age Head: Normocephalic, without obvious abnormality Neck: no adenopathy Resp: clear to auscultation bilaterally Cardio: regular rate and rhythm, S1, S2 normal, no murmur, click, rub or gallop GI: soft, non-tender; bowel sounds normal; no masses,  no organomegaly Extremities: extremities normal, atraumatic, no cyanosis or edema Pulses: 2+ and symmetric Skin: Skin color, texture, turgor normal. No rashes or lesions Neurologic: Grossly normal Incision/Wound: na  Assessment/Plan Plan removal fb lif  Kerstie Agent R 05/23/2011, 3:09 PM

## 2011-05-23 NOTE — Anesthesia Preprocedure Evaluation (Addendum)
Anesthesia Evaluation  Patient identified by MRN, date of birth, ID band Patient awake and Patient confused    Reviewed: Allergy & Precautions, H&P , NPO status , Patient's Chart, lab work & pertinent test results  Airway Mallampati: I TM Distance: >3 FB Neck ROM: Full    Dental   Pulmonary    Pulmonary exam normal       Cardiovascular + pacemaker     Neuro/Psych    GI/Hepatic   Endo/Other    Renal/GU      Musculoskeletal   Abdominal   Peds  Hematology   Anesthesia Other Findings   Reproductive/Obstetrics                          Anesthesia Physical Anesthesia Plan  ASA: II  Anesthesia Plan: Bier Block   Post-op Pain Management:    Induction: Intravenous  Airway Management Planned: Simple Face Mask  Additional Equipment:   Intra-op Plan:   Post-operative Plan:   Informed Consent: I have reviewed the patients History and Physical, chart, labs and discussed the procedure including the risks, benefits and alternatives for the proposed anesthesia with the patient or authorized representative who has indicated his/her understanding and acceptance.   Dental advisory given  Plan Discussed with: CRNA and Surgeon  Anesthesia Plan Comments:         Anesthesia Quick Evaluation

## 2011-05-23 NOTE — Anesthesia Postprocedure Evaluation (Signed)
  Anesthesia Post-op Note  Patient: Mark Robinson  Procedure(s) Performed:  FOREIGN BODY REMOVAL ADULT - left index finger; LESION REMOVAL - excision open lesion left index finger  Patient Location: PACU  Anesthesia Type: Bier block  Level of Consciousness: awake and alert   Airway and Oxygen Therapy: Patient Spontanous Breathing  Post-op Pain: mild  Post-op Assessment: Post-op Vital signs reviewed, Patient's Cardiovascular Status Stable, Respiratory Function Stable, Patent Airway, No signs of Nausea or vomiting and Pain level controlled  Post-op Vital Signs: Reviewed and stable  Complications: No apparent anesthesia complications

## 2011-05-28 ENCOUNTER — Encounter (HOSPITAL_BASED_OUTPATIENT_CLINIC_OR_DEPARTMENT_OTHER): Payer: Self-pay | Admitting: Orthopedic Surgery

## 2011-05-29 NOTE — Progress Notes (Signed)
Addended by: Chailyn Racette on: 05/29/2011 11:22 AM   Modules accepted: Orders  

## 2011-06-23 ENCOUNTER — Other Ambulatory Visit: Payer: Self-pay | Admitting: Cardiology

## 2011-07-23 DIAGNOSIS — I495 Sick sinus syndrome: Secondary | ICD-10-CM | POA: Diagnosis not present

## 2011-07-23 DIAGNOSIS — Z950 Presence of cardiac pacemaker: Secondary | ICD-10-CM | POA: Diagnosis not present

## 2011-07-23 DIAGNOSIS — I359 Nonrheumatic aortic valve disorder, unspecified: Secondary | ICD-10-CM | POA: Diagnosis not present

## 2011-07-23 DIAGNOSIS — Z7901 Long term (current) use of anticoagulants: Secondary | ICD-10-CM | POA: Diagnosis not present

## 2011-07-23 DIAGNOSIS — I4891 Unspecified atrial fibrillation: Secondary | ICD-10-CM | POA: Diagnosis not present

## 2011-07-25 DIAGNOSIS — R490 Dysphonia: Secondary | ICD-10-CM | POA: Diagnosis not present

## 2011-08-15 DIAGNOSIS — J209 Acute bronchitis, unspecified: Secondary | ICD-10-CM | POA: Diagnosis not present

## 2011-08-15 DIAGNOSIS — R05 Cough: Secondary | ICD-10-CM | POA: Diagnosis not present

## 2011-08-15 DIAGNOSIS — R030 Elevated blood-pressure reading, without diagnosis of hypertension: Secondary | ICD-10-CM | POA: Diagnosis not present

## 2011-08-15 DIAGNOSIS — J069 Acute upper respiratory infection, unspecified: Secondary | ICD-10-CM | POA: Diagnosis not present

## 2011-08-21 DIAGNOSIS — I359 Nonrheumatic aortic valve disorder, unspecified: Secondary | ICD-10-CM | POA: Diagnosis not present

## 2011-08-21 DIAGNOSIS — Z950 Presence of cardiac pacemaker: Secondary | ICD-10-CM | POA: Diagnosis not present

## 2011-08-21 DIAGNOSIS — Z7901 Long term (current) use of anticoagulants: Secondary | ICD-10-CM | POA: Diagnosis not present

## 2011-08-21 DIAGNOSIS — I4891 Unspecified atrial fibrillation: Secondary | ICD-10-CM | POA: Diagnosis not present

## 2011-08-21 DIAGNOSIS — I495 Sick sinus syndrome: Secondary | ICD-10-CM | POA: Diagnosis not present

## 2011-08-22 DIAGNOSIS — M999 Biomechanical lesion, unspecified: Secondary | ICD-10-CM | POA: Diagnosis not present

## 2011-08-22 DIAGNOSIS — M545 Low back pain: Secondary | ICD-10-CM | POA: Diagnosis not present

## 2011-08-27 DIAGNOSIS — J019 Acute sinusitis, unspecified: Secondary | ICD-10-CM | POA: Diagnosis not present

## 2011-08-27 DIAGNOSIS — L570 Actinic keratosis: Secondary | ICD-10-CM | POA: Diagnosis not present

## 2011-08-27 DIAGNOSIS — D485 Neoplasm of uncertain behavior of skin: Secondary | ICD-10-CM | POA: Diagnosis not present

## 2011-08-27 DIAGNOSIS — Z85828 Personal history of other malignant neoplasm of skin: Secondary | ICD-10-CM | POA: Diagnosis not present

## 2011-08-27 DIAGNOSIS — R030 Elevated blood-pressure reading, without diagnosis of hypertension: Secondary | ICD-10-CM | POA: Diagnosis not present

## 2011-09-17 DIAGNOSIS — I4891 Unspecified atrial fibrillation: Secondary | ICD-10-CM | POA: Diagnosis not present

## 2011-09-17 DIAGNOSIS — Z7901 Long term (current) use of anticoagulants: Secondary | ICD-10-CM | POA: Diagnosis not present

## 2011-09-17 DIAGNOSIS — I359 Nonrheumatic aortic valve disorder, unspecified: Secondary | ICD-10-CM | POA: Diagnosis not present

## 2011-09-17 DIAGNOSIS — R05 Cough: Secondary | ICD-10-CM | POA: Diagnosis not present

## 2011-09-17 DIAGNOSIS — I495 Sick sinus syndrome: Secondary | ICD-10-CM | POA: Diagnosis not present

## 2011-09-17 DIAGNOSIS — R490 Dysphonia: Secondary | ICD-10-CM | POA: Diagnosis not present

## 2011-09-17 DIAGNOSIS — Z950 Presence of cardiac pacemaker: Secondary | ICD-10-CM | POA: Diagnosis not present

## 2011-09-17 DIAGNOSIS — R059 Cough, unspecified: Secondary | ICD-10-CM | POA: Diagnosis not present

## 2011-09-18 DIAGNOSIS — I359 Nonrheumatic aortic valve disorder, unspecified: Secondary | ICD-10-CM | POA: Diagnosis not present

## 2011-09-18 DIAGNOSIS — Z950 Presence of cardiac pacemaker: Secondary | ICD-10-CM | POA: Diagnosis not present

## 2011-09-18 DIAGNOSIS — Z7901 Long term (current) use of anticoagulants: Secondary | ICD-10-CM | POA: Diagnosis not present

## 2011-09-18 DIAGNOSIS — I495 Sick sinus syndrome: Secondary | ICD-10-CM | POA: Diagnosis not present

## 2011-09-18 DIAGNOSIS — I4891 Unspecified atrial fibrillation: Secondary | ICD-10-CM | POA: Diagnosis not present

## 2011-09-19 DIAGNOSIS — Z1289 Encounter for screening for malignant neoplasm of other sites: Secondary | ICD-10-CM | POA: Diagnosis not present

## 2011-09-19 DIAGNOSIS — R490 Dysphonia: Secondary | ICD-10-CM | POA: Diagnosis not present

## 2011-09-19 DIAGNOSIS — Z923 Personal history of irradiation: Secondary | ICD-10-CM | POA: Diagnosis not present

## 2011-09-19 DIAGNOSIS — R9389 Abnormal findings on diagnostic imaging of other specified body structures: Secondary | ICD-10-CM | POA: Diagnosis not present

## 2011-09-19 DIAGNOSIS — C8581 Other specified types of non-Hodgkin lymphoma, lymph nodes of head, face, and neck: Secondary | ICD-10-CM | POA: Diagnosis not present

## 2011-09-19 DIAGNOSIS — C8589 Other specified types of non-Hodgkin lymphoma, extranodal and solid organ sites: Secondary | ICD-10-CM | POA: Diagnosis not present

## 2011-09-19 DIAGNOSIS — J383 Other diseases of vocal cords: Secondary | ICD-10-CM | POA: Diagnosis not present

## 2011-09-19 DIAGNOSIS — D72819 Decreased white blood cell count, unspecified: Secondary | ICD-10-CM | POA: Diagnosis not present

## 2011-10-01 DIAGNOSIS — C8589 Other specified types of non-Hodgkin lymphoma, extranodal and solid organ sites: Secondary | ICD-10-CM | POA: Diagnosis not present

## 2011-10-01 DIAGNOSIS — I4891 Unspecified atrial fibrillation: Secondary | ICD-10-CM | POA: Diagnosis not present

## 2011-10-01 DIAGNOSIS — R509 Fever, unspecified: Secondary | ICD-10-CM | POA: Diagnosis not present

## 2011-10-01 DIAGNOSIS — J13 Pneumonia due to Streptococcus pneumoniae: Secondary | ICD-10-CM | POA: Diagnosis not present

## 2011-10-03 DIAGNOSIS — I4891 Unspecified atrial fibrillation: Secondary | ICD-10-CM | POA: Diagnosis not present

## 2011-10-03 DIAGNOSIS — I495 Sick sinus syndrome: Secondary | ICD-10-CM | POA: Diagnosis not present

## 2011-10-03 DIAGNOSIS — Z950 Presence of cardiac pacemaker: Secondary | ICD-10-CM | POA: Diagnosis not present

## 2011-10-03 DIAGNOSIS — I359 Nonrheumatic aortic valve disorder, unspecified: Secondary | ICD-10-CM | POA: Diagnosis not present

## 2011-10-03 DIAGNOSIS — Z7901 Long term (current) use of anticoagulants: Secondary | ICD-10-CM | POA: Diagnosis not present

## 2011-10-11 DIAGNOSIS — J13 Pneumonia due to Streptococcus pneumoniae: Secondary | ICD-10-CM | POA: Diagnosis not present

## 2011-10-11 DIAGNOSIS — R509 Fever, unspecified: Secondary | ICD-10-CM | POA: Diagnosis not present

## 2011-10-11 DIAGNOSIS — C8589 Other specified types of non-Hodgkin lymphoma, extranodal and solid organ sites: Secondary | ICD-10-CM | POA: Diagnosis not present

## 2011-10-11 DIAGNOSIS — I4891 Unspecified atrial fibrillation: Secondary | ICD-10-CM | POA: Diagnosis not present

## 2011-10-16 DIAGNOSIS — R490 Dysphonia: Secondary | ICD-10-CM | POA: Diagnosis not present

## 2011-10-19 DIAGNOSIS — Z950 Presence of cardiac pacemaker: Secondary | ICD-10-CM | POA: Diagnosis not present

## 2011-10-19 DIAGNOSIS — I495 Sick sinus syndrome: Secondary | ICD-10-CM | POA: Diagnosis not present

## 2011-10-19 DIAGNOSIS — I4891 Unspecified atrial fibrillation: Secondary | ICD-10-CM | POA: Diagnosis not present

## 2011-10-19 DIAGNOSIS — Z7901 Long term (current) use of anticoagulants: Secondary | ICD-10-CM | POA: Diagnosis not present

## 2011-10-19 DIAGNOSIS — I359 Nonrheumatic aortic valve disorder, unspecified: Secondary | ICD-10-CM | POA: Diagnosis not present

## 2011-11-19 DIAGNOSIS — I495 Sick sinus syndrome: Secondary | ICD-10-CM | POA: Diagnosis not present

## 2011-11-19 DIAGNOSIS — I359 Nonrheumatic aortic valve disorder, unspecified: Secondary | ICD-10-CM | POA: Diagnosis not present

## 2011-11-19 DIAGNOSIS — Z950 Presence of cardiac pacemaker: Secondary | ICD-10-CM | POA: Diagnosis not present

## 2011-11-19 DIAGNOSIS — I4891 Unspecified atrial fibrillation: Secondary | ICD-10-CM | POA: Diagnosis not present

## 2011-11-19 DIAGNOSIS — Z7901 Long term (current) use of anticoagulants: Secondary | ICD-10-CM | POA: Diagnosis not present

## 2011-11-20 DIAGNOSIS — I4891 Unspecified atrial fibrillation: Secondary | ICD-10-CM | POA: Diagnosis not present

## 2011-11-20 DIAGNOSIS — E785 Hyperlipidemia, unspecified: Secondary | ICD-10-CM | POA: Diagnosis not present

## 2011-11-20 DIAGNOSIS — M999 Biomechanical lesion, unspecified: Secondary | ICD-10-CM | POA: Diagnosis not present

## 2011-11-20 DIAGNOSIS — M5137 Other intervertebral disc degeneration, lumbosacral region: Secondary | ICD-10-CM | POA: Diagnosis not present

## 2011-11-20 DIAGNOSIS — Z79899 Other long term (current) drug therapy: Secondary | ICD-10-CM | POA: Diagnosis not present

## 2011-11-20 DIAGNOSIS — M545 Low back pain: Secondary | ICD-10-CM | POA: Diagnosis not present

## 2011-11-20 DIAGNOSIS — Z125 Encounter for screening for malignant neoplasm of prostate: Secondary | ICD-10-CM | POA: Diagnosis not present

## 2011-11-26 DIAGNOSIS — R490 Dysphonia: Secondary | ICD-10-CM | POA: Diagnosis not present

## 2011-11-27 DIAGNOSIS — E785 Hyperlipidemia, unspecified: Secondary | ICD-10-CM | POA: Diagnosis not present

## 2011-11-27 DIAGNOSIS — R7301 Impaired fasting glucose: Secondary | ICD-10-CM | POA: Diagnosis not present

## 2011-11-27 DIAGNOSIS — Z125 Encounter for screening for malignant neoplasm of prostate: Secondary | ICD-10-CM | POA: Diagnosis not present

## 2011-11-27 DIAGNOSIS — I4891 Unspecified atrial fibrillation: Secondary | ICD-10-CM | POA: Diagnosis not present

## 2011-11-27 DIAGNOSIS — Z0000 Encounter for general adult medical examination without abnormal findings: Secondary | ICD-10-CM | POA: Diagnosis not present

## 2011-11-27 DIAGNOSIS — C8589 Other specified types of non-Hodgkin lymphoma, extranodal and solid organ sites: Secondary | ICD-10-CM | POA: Diagnosis not present

## 2011-11-28 DIAGNOSIS — Z1212 Encounter for screening for malignant neoplasm of rectum: Secondary | ICD-10-CM | POA: Diagnosis not present

## 2011-12-17 DIAGNOSIS — I495 Sick sinus syndrome: Secondary | ICD-10-CM | POA: Diagnosis not present

## 2011-12-17 DIAGNOSIS — I359 Nonrheumatic aortic valve disorder, unspecified: Secondary | ICD-10-CM | POA: Diagnosis not present

## 2011-12-17 DIAGNOSIS — I4891 Unspecified atrial fibrillation: Secondary | ICD-10-CM | POA: Diagnosis not present

## 2011-12-17 DIAGNOSIS — Z950 Presence of cardiac pacemaker: Secondary | ICD-10-CM | POA: Diagnosis not present

## 2011-12-17 DIAGNOSIS — Z7901 Long term (current) use of anticoagulants: Secondary | ICD-10-CM | POA: Diagnosis not present

## 2011-12-31 DIAGNOSIS — I495 Sick sinus syndrome: Secondary | ICD-10-CM | POA: Diagnosis not present

## 2011-12-31 DIAGNOSIS — Z950 Presence of cardiac pacemaker: Secondary | ICD-10-CM | POA: Diagnosis not present

## 2011-12-31 DIAGNOSIS — I4891 Unspecified atrial fibrillation: Secondary | ICD-10-CM | POA: Diagnosis not present

## 2011-12-31 DIAGNOSIS — Z7901 Long term (current) use of anticoagulants: Secondary | ICD-10-CM | POA: Diagnosis not present

## 2011-12-31 DIAGNOSIS — I359 Nonrheumatic aortic valve disorder, unspecified: Secondary | ICD-10-CM | POA: Diagnosis not present

## 2012-01-14 DIAGNOSIS — Z950 Presence of cardiac pacemaker: Secondary | ICD-10-CM | POA: Diagnosis not present

## 2012-01-14 DIAGNOSIS — Z7901 Long term (current) use of anticoagulants: Secondary | ICD-10-CM | POA: Diagnosis not present

## 2012-01-14 DIAGNOSIS — I359 Nonrheumatic aortic valve disorder, unspecified: Secondary | ICD-10-CM | POA: Diagnosis not present

## 2012-01-14 DIAGNOSIS — I495 Sick sinus syndrome: Secondary | ICD-10-CM | POA: Diagnosis not present

## 2012-01-14 DIAGNOSIS — I4891 Unspecified atrial fibrillation: Secondary | ICD-10-CM | POA: Diagnosis not present

## 2012-01-30 DIAGNOSIS — C8589 Other specified types of non-Hodgkin lymphoma, extranodal and solid organ sites: Secondary | ICD-10-CM | POA: Diagnosis not present

## 2012-01-31 DIAGNOSIS — M999 Biomechanical lesion, unspecified: Secondary | ICD-10-CM | POA: Diagnosis not present

## 2012-01-31 DIAGNOSIS — M9981 Other biomechanical lesions of cervical region: Secondary | ICD-10-CM | POA: Diagnosis not present

## 2012-01-31 DIAGNOSIS — M545 Low back pain: Secondary | ICD-10-CM | POA: Diagnosis not present

## 2012-01-31 DIAGNOSIS — M5137 Other intervertebral disc degeneration, lumbosacral region: Secondary | ICD-10-CM | POA: Diagnosis not present

## 2012-02-25 DIAGNOSIS — I495 Sick sinus syndrome: Secondary | ICD-10-CM | POA: Diagnosis not present

## 2012-02-25 DIAGNOSIS — I4891 Unspecified atrial fibrillation: Secondary | ICD-10-CM | POA: Diagnosis not present

## 2012-02-25 DIAGNOSIS — L570 Actinic keratosis: Secondary | ICD-10-CM | POA: Diagnosis not present

## 2012-02-25 DIAGNOSIS — L820 Inflamed seborrheic keratosis: Secondary | ICD-10-CM | POA: Diagnosis not present

## 2012-02-25 DIAGNOSIS — I359 Nonrheumatic aortic valve disorder, unspecified: Secondary | ICD-10-CM | POA: Diagnosis not present

## 2012-02-25 DIAGNOSIS — Z950 Presence of cardiac pacemaker: Secondary | ICD-10-CM | POA: Diagnosis not present

## 2012-02-25 DIAGNOSIS — L905 Scar conditions and fibrosis of skin: Secondary | ICD-10-CM | POA: Diagnosis not present

## 2012-02-25 DIAGNOSIS — D485 Neoplasm of uncertain behavior of skin: Secondary | ICD-10-CM | POA: Diagnosis not present

## 2012-02-25 DIAGNOSIS — Z7901 Long term (current) use of anticoagulants: Secondary | ICD-10-CM | POA: Diagnosis not present

## 2012-02-25 DIAGNOSIS — R238 Other skin changes: Secondary | ICD-10-CM | POA: Diagnosis not present

## 2012-02-26 DIAGNOSIS — H2510 Age-related nuclear cataract, unspecified eye: Secondary | ICD-10-CM | POA: Diagnosis not present

## 2012-02-29 ENCOUNTER — Other Ambulatory Visit: Payer: Self-pay | Admitting: Cardiology

## 2012-03-17 DIAGNOSIS — I359 Nonrheumatic aortic valve disorder, unspecified: Secondary | ICD-10-CM | POA: Diagnosis not present

## 2012-03-17 DIAGNOSIS — Z7901 Long term (current) use of anticoagulants: Secondary | ICD-10-CM | POA: Diagnosis not present

## 2012-03-17 DIAGNOSIS — Z950 Presence of cardiac pacemaker: Secondary | ICD-10-CM | POA: Diagnosis not present

## 2012-03-17 DIAGNOSIS — I4891 Unspecified atrial fibrillation: Secondary | ICD-10-CM | POA: Diagnosis not present

## 2012-03-17 DIAGNOSIS — I495 Sick sinus syndrome: Secondary | ICD-10-CM | POA: Diagnosis not present

## 2012-03-25 DIAGNOSIS — Z23 Encounter for immunization: Secondary | ICD-10-CM | POA: Diagnosis not present

## 2012-03-31 DIAGNOSIS — I4891 Unspecified atrial fibrillation: Secondary | ICD-10-CM | POA: Diagnosis not present

## 2012-03-31 DIAGNOSIS — I359 Nonrheumatic aortic valve disorder, unspecified: Secondary | ICD-10-CM | POA: Diagnosis not present

## 2012-03-31 DIAGNOSIS — I495 Sick sinus syndrome: Secondary | ICD-10-CM | POA: Diagnosis not present

## 2012-03-31 DIAGNOSIS — N400 Enlarged prostate without lower urinary tract symptoms: Secondary | ICD-10-CM | POA: Diagnosis not present

## 2012-03-31 DIAGNOSIS — R490 Dysphonia: Secondary | ICD-10-CM | POA: Diagnosis not present

## 2012-03-31 DIAGNOSIS — Z950 Presence of cardiac pacemaker: Secondary | ICD-10-CM | POA: Diagnosis not present

## 2012-03-31 DIAGNOSIS — N529 Male erectile dysfunction, unspecified: Secondary | ICD-10-CM | POA: Diagnosis not present

## 2012-03-31 DIAGNOSIS — Z7901 Long term (current) use of anticoagulants: Secondary | ICD-10-CM | POA: Diagnosis not present

## 2012-04-21 DIAGNOSIS — I4891 Unspecified atrial fibrillation: Secondary | ICD-10-CM | POA: Diagnosis not present

## 2012-04-21 DIAGNOSIS — I495 Sick sinus syndrome: Secondary | ICD-10-CM | POA: Diagnosis not present

## 2012-04-21 DIAGNOSIS — Z7901 Long term (current) use of anticoagulants: Secondary | ICD-10-CM | POA: Diagnosis not present

## 2012-04-21 DIAGNOSIS — I359 Nonrheumatic aortic valve disorder, unspecified: Secondary | ICD-10-CM | POA: Diagnosis not present

## 2012-04-21 DIAGNOSIS — Z950 Presence of cardiac pacemaker: Secondary | ICD-10-CM | POA: Diagnosis not present

## 2012-05-19 DIAGNOSIS — I4891 Unspecified atrial fibrillation: Secondary | ICD-10-CM | POA: Diagnosis not present

## 2012-05-19 DIAGNOSIS — C8589 Other specified types of non-Hodgkin lymphoma, extranodal and solid organ sites: Secondary | ICD-10-CM | POA: Diagnosis not present

## 2012-05-19 DIAGNOSIS — R911 Solitary pulmonary nodule: Secondary | ICD-10-CM | POA: Diagnosis not present

## 2012-05-19 DIAGNOSIS — J329 Chronic sinusitis, unspecified: Secondary | ICD-10-CM | POA: Diagnosis not present

## 2012-05-19 DIAGNOSIS — Z950 Presence of cardiac pacemaker: Secondary | ICD-10-CM | POA: Diagnosis not present

## 2012-05-19 DIAGNOSIS — K7689 Other specified diseases of liver: Secondary | ICD-10-CM | POA: Diagnosis not present

## 2012-05-19 DIAGNOSIS — K5730 Diverticulosis of large intestine without perforation or abscess without bleeding: Secondary | ICD-10-CM | POA: Diagnosis not present

## 2012-05-19 DIAGNOSIS — K8020 Calculus of gallbladder without cholecystitis without obstruction: Secondary | ICD-10-CM | POA: Diagnosis not present

## 2012-05-19 DIAGNOSIS — I2510 Atherosclerotic heart disease of native coronary artery without angina pectoris: Secondary | ICD-10-CM | POA: Diagnosis not present

## 2012-05-19 DIAGNOSIS — N329 Bladder disorder, unspecified: Secondary | ICD-10-CM | POA: Diagnosis not present

## 2012-05-19 DIAGNOSIS — I495 Sick sinus syndrome: Secondary | ICD-10-CM | POA: Diagnosis not present

## 2012-05-19 DIAGNOSIS — I359 Nonrheumatic aortic valve disorder, unspecified: Secondary | ICD-10-CM | POA: Diagnosis not present

## 2012-05-19 DIAGNOSIS — N289 Disorder of kidney and ureter, unspecified: Secondary | ICD-10-CM | POA: Diagnosis not present

## 2012-05-19 DIAGNOSIS — Z7901 Long term (current) use of anticoagulants: Secondary | ICD-10-CM | POA: Diagnosis not present

## 2012-05-19 DIAGNOSIS — D801 Nonfamilial hypogammaglobulinemia: Secondary | ICD-10-CM | POA: Diagnosis not present

## 2012-05-21 DIAGNOSIS — D801 Nonfamilial hypogammaglobulinemia: Secondary | ICD-10-CM | POA: Diagnosis not present

## 2012-05-22 DIAGNOSIS — Z950 Presence of cardiac pacemaker: Secondary | ICD-10-CM | POA: Diagnosis not present

## 2012-05-22 DIAGNOSIS — I4891 Unspecified atrial fibrillation: Secondary | ICD-10-CM | POA: Diagnosis not present

## 2012-05-22 DIAGNOSIS — I2510 Atherosclerotic heart disease of native coronary artery without angina pectoris: Secondary | ICD-10-CM | POA: Diagnosis not present

## 2012-05-22 DIAGNOSIS — R0602 Shortness of breath: Secondary | ICD-10-CM | POA: Diagnosis not present

## 2012-05-22 DIAGNOSIS — I359 Nonrheumatic aortic valve disorder, unspecified: Secondary | ICD-10-CM | POA: Diagnosis not present

## 2012-05-22 DIAGNOSIS — I495 Sick sinus syndrome: Secondary | ICD-10-CM | POA: Diagnosis not present

## 2012-05-22 DIAGNOSIS — Z7901 Long term (current) use of anticoagulants: Secondary | ICD-10-CM | POA: Diagnosis not present

## 2012-05-27 DIAGNOSIS — M9981 Other biomechanical lesions of cervical region: Secondary | ICD-10-CM | POA: Diagnosis not present

## 2012-05-27 DIAGNOSIS — M999 Biomechanical lesion, unspecified: Secondary | ICD-10-CM | POA: Diagnosis not present

## 2012-05-27 DIAGNOSIS — M5412 Radiculopathy, cervical region: Secondary | ICD-10-CM | POA: Diagnosis not present

## 2012-05-27 DIAGNOSIS — M5030 Other cervical disc degeneration, unspecified cervical region: Secondary | ICD-10-CM | POA: Diagnosis not present

## 2012-06-16 DIAGNOSIS — Z7901 Long term (current) use of anticoagulants: Secondary | ICD-10-CM | POA: Diagnosis not present

## 2012-06-16 DIAGNOSIS — I495 Sick sinus syndrome: Secondary | ICD-10-CM | POA: Diagnosis not present

## 2012-06-16 DIAGNOSIS — I2510 Atherosclerotic heart disease of native coronary artery without angina pectoris: Secondary | ICD-10-CM | POA: Diagnosis not present

## 2012-06-16 DIAGNOSIS — D801 Nonfamilial hypogammaglobulinemia: Secondary | ICD-10-CM | POA: Diagnosis not present

## 2012-06-16 DIAGNOSIS — I4891 Unspecified atrial fibrillation: Secondary | ICD-10-CM | POA: Diagnosis not present

## 2012-06-16 DIAGNOSIS — Z950 Presence of cardiac pacemaker: Secondary | ICD-10-CM | POA: Diagnosis not present

## 2012-06-16 DIAGNOSIS — I359 Nonrheumatic aortic valve disorder, unspecified: Secondary | ICD-10-CM | POA: Diagnosis not present

## 2012-07-12 DIAGNOSIS — Z950 Presence of cardiac pacemaker: Secondary | ICD-10-CM | POA: Diagnosis not present

## 2012-07-12 DIAGNOSIS — I4891 Unspecified atrial fibrillation: Secondary | ICD-10-CM | POA: Diagnosis not present

## 2012-07-14 DIAGNOSIS — D801 Nonfamilial hypogammaglobulinemia: Secondary | ICD-10-CM | POA: Diagnosis not present

## 2012-08-11 DIAGNOSIS — D801 Nonfamilial hypogammaglobulinemia: Secondary | ICD-10-CM | POA: Diagnosis not present

## 2012-08-12 DIAGNOSIS — M5030 Other cervical disc degeneration, unspecified cervical region: Secondary | ICD-10-CM | POA: Diagnosis not present

## 2012-08-12 DIAGNOSIS — M5412 Radiculopathy, cervical region: Secondary | ICD-10-CM | POA: Diagnosis not present

## 2012-08-12 DIAGNOSIS — M9981 Other biomechanical lesions of cervical region: Secondary | ICD-10-CM | POA: Diagnosis not present

## 2012-09-08 DIAGNOSIS — D801 Nonfamilial hypogammaglobulinemia: Secondary | ICD-10-CM | POA: Diagnosis not present

## 2012-09-08 DIAGNOSIS — C8589 Other specified types of non-Hodgkin lymphoma, extranodal and solid organ sites: Secondary | ICD-10-CM | POA: Diagnosis not present

## 2012-09-29 DIAGNOSIS — R490 Dysphonia: Secondary | ICD-10-CM | POA: Diagnosis not present

## 2012-09-29 DIAGNOSIS — J387 Other diseases of larynx: Secondary | ICD-10-CM | POA: Diagnosis not present

## 2012-10-07 DIAGNOSIS — I2510 Atherosclerotic heart disease of native coronary artery without angina pectoris: Secondary | ICD-10-CM | POA: Diagnosis not present

## 2012-10-07 DIAGNOSIS — Z950 Presence of cardiac pacemaker: Secondary | ICD-10-CM | POA: Diagnosis not present

## 2012-10-07 DIAGNOSIS — Z7901 Long term (current) use of anticoagulants: Secondary | ICD-10-CM | POA: Diagnosis not present

## 2012-10-07 DIAGNOSIS — I495 Sick sinus syndrome: Secondary | ICD-10-CM | POA: Diagnosis not present

## 2012-10-07 DIAGNOSIS — I4891 Unspecified atrial fibrillation: Secondary | ICD-10-CM | POA: Diagnosis not present

## 2012-10-07 DIAGNOSIS — I359 Nonrheumatic aortic valve disorder, unspecified: Secondary | ICD-10-CM | POA: Diagnosis not present

## 2012-10-13 DIAGNOSIS — D801 Nonfamilial hypogammaglobulinemia: Secondary | ICD-10-CM | POA: Diagnosis not present

## 2012-10-13 DIAGNOSIS — C8589 Other specified types of non-Hodgkin lymphoma, extranodal and solid organ sites: Secondary | ICD-10-CM | POA: Diagnosis not present

## 2012-10-13 DIAGNOSIS — J320 Chronic maxillary sinusitis: Secondary | ICD-10-CM | POA: Diagnosis not present

## 2012-11-20 DIAGNOSIS — Z79899 Other long term (current) drug therapy: Secondary | ICD-10-CM | POA: Diagnosis not present

## 2012-11-20 DIAGNOSIS — R7301 Impaired fasting glucose: Secondary | ICD-10-CM | POA: Diagnosis not present

## 2012-11-20 DIAGNOSIS — Z125 Encounter for screening for malignant neoplasm of prostate: Secondary | ICD-10-CM | POA: Diagnosis not present

## 2012-11-20 DIAGNOSIS — E785 Hyperlipidemia, unspecified: Secondary | ICD-10-CM | POA: Diagnosis not present

## 2012-11-24 DIAGNOSIS — Z85828 Personal history of other malignant neoplasm of skin: Secondary | ICD-10-CM | POA: Diagnosis not present

## 2012-11-24 DIAGNOSIS — L821 Other seborrheic keratosis: Secondary | ICD-10-CM | POA: Diagnosis not present

## 2012-11-24 DIAGNOSIS — M5412 Radiculopathy, cervical region: Secondary | ICD-10-CM | POA: Diagnosis not present

## 2012-11-24 DIAGNOSIS — M9981 Other biomechanical lesions of cervical region: Secondary | ICD-10-CM | POA: Diagnosis not present

## 2012-11-24 DIAGNOSIS — I4891 Unspecified atrial fibrillation: Secondary | ICD-10-CM | POA: Diagnosis not present

## 2012-11-24 DIAGNOSIS — L570 Actinic keratosis: Secondary | ICD-10-CM | POA: Diagnosis not present

## 2012-11-24 DIAGNOSIS — D485 Neoplasm of uncertain behavior of skin: Secondary | ICD-10-CM | POA: Diagnosis not present

## 2012-11-24 DIAGNOSIS — I2510 Atherosclerotic heart disease of native coronary artery without angina pectoris: Secondary | ICD-10-CM | POA: Diagnosis not present

## 2012-11-24 DIAGNOSIS — I359 Nonrheumatic aortic valve disorder, unspecified: Secondary | ICD-10-CM | POA: Diagnosis not present

## 2012-11-24 DIAGNOSIS — I495 Sick sinus syndrome: Secondary | ICD-10-CM | POA: Diagnosis not present

## 2012-11-24 DIAGNOSIS — Z950 Presence of cardiac pacemaker: Secondary | ICD-10-CM | POA: Diagnosis not present

## 2012-11-24 DIAGNOSIS — Z7901 Long term (current) use of anticoagulants: Secondary | ICD-10-CM | POA: Diagnosis not present

## 2012-11-25 DIAGNOSIS — Z1212 Encounter for screening for malignant neoplasm of rectum: Secondary | ICD-10-CM | POA: Diagnosis not present

## 2012-11-27 DIAGNOSIS — G609 Hereditary and idiopathic neuropathy, unspecified: Secondary | ICD-10-CM | POA: Diagnosis not present

## 2012-11-27 DIAGNOSIS — R7301 Impaired fasting glucose: Secondary | ICD-10-CM | POA: Diagnosis not present

## 2012-11-27 DIAGNOSIS — Z1331 Encounter for screening for depression: Secondary | ICD-10-CM | POA: Diagnosis not present

## 2012-11-27 DIAGNOSIS — Z0000 Encounter for general adult medical examination without abnormal findings: Secondary | ICD-10-CM | POA: Diagnosis not present

## 2012-11-27 DIAGNOSIS — I4891 Unspecified atrial fibrillation: Secondary | ICD-10-CM | POA: Diagnosis not present

## 2012-11-27 DIAGNOSIS — E785 Hyperlipidemia, unspecified: Secondary | ICD-10-CM | POA: Diagnosis not present

## 2012-11-27 DIAGNOSIS — N401 Enlarged prostate with lower urinary tract symptoms: Secondary | ICD-10-CM | POA: Diagnosis not present

## 2012-11-27 DIAGNOSIS — Z125 Encounter for screening for malignant neoplasm of prostate: Secondary | ICD-10-CM | POA: Diagnosis not present

## 2012-11-27 DIAGNOSIS — C8589 Other specified types of non-Hodgkin lymphoma, extranodal and solid organ sites: Secondary | ICD-10-CM | POA: Diagnosis not present

## 2012-12-07 DIAGNOSIS — J00 Acute nasopharyngitis [common cold]: Secondary | ICD-10-CM | POA: Diagnosis not present

## 2012-12-07 DIAGNOSIS — H6690 Otitis media, unspecified, unspecified ear: Secondary | ICD-10-CM | POA: Diagnosis not present

## 2012-12-25 DIAGNOSIS — L821 Other seborrheic keratosis: Secondary | ICD-10-CM | POA: Diagnosis not present

## 2012-12-25 DIAGNOSIS — D485 Neoplasm of uncertain behavior of skin: Secondary | ICD-10-CM | POA: Diagnosis not present

## 2012-12-25 DIAGNOSIS — L905 Scar conditions and fibrosis of skin: Secondary | ICD-10-CM | POA: Diagnosis not present

## 2012-12-25 DIAGNOSIS — C44320 Squamous cell carcinoma of skin of unspecified parts of face: Secondary | ICD-10-CM | POA: Diagnosis not present

## 2013-01-06 DIAGNOSIS — I2510 Atherosclerotic heart disease of native coronary artery without angina pectoris: Secondary | ICD-10-CM | POA: Diagnosis not present

## 2013-01-06 DIAGNOSIS — I495 Sick sinus syndrome: Secondary | ICD-10-CM | POA: Diagnosis not present

## 2013-01-06 DIAGNOSIS — I4891 Unspecified atrial fibrillation: Secondary | ICD-10-CM | POA: Diagnosis not present

## 2013-01-06 DIAGNOSIS — Z7901 Long term (current) use of anticoagulants: Secondary | ICD-10-CM | POA: Diagnosis not present

## 2013-01-06 DIAGNOSIS — Z950 Presence of cardiac pacemaker: Secondary | ICD-10-CM | POA: Diagnosis not present

## 2013-01-06 DIAGNOSIS — I359 Nonrheumatic aortic valve disorder, unspecified: Secondary | ICD-10-CM | POA: Diagnosis not present

## 2013-01-07 DIAGNOSIS — K219 Gastro-esophageal reflux disease without esophagitis: Secondary | ICD-10-CM | POA: Diagnosis not present

## 2013-01-07 DIAGNOSIS — J387 Other diseases of larynx: Secondary | ICD-10-CM | POA: Diagnosis not present

## 2013-01-07 DIAGNOSIS — R490 Dysphonia: Secondary | ICD-10-CM | POA: Diagnosis not present

## 2013-01-08 DIAGNOSIS — C44621 Squamous cell carcinoma of skin of unspecified upper limb, including shoulder: Secondary | ICD-10-CM | POA: Diagnosis not present

## 2013-02-03 DIAGNOSIS — K219 Gastro-esophageal reflux disease without esophagitis: Secondary | ICD-10-CM | POA: Diagnosis not present

## 2013-02-03 DIAGNOSIS — J387 Other diseases of larynx: Secondary | ICD-10-CM | POA: Diagnosis not present

## 2013-02-03 DIAGNOSIS — R0982 Postnasal drip: Secondary | ICD-10-CM | POA: Diagnosis not present

## 2013-02-03 DIAGNOSIS — J329 Chronic sinusitis, unspecified: Secondary | ICD-10-CM | POA: Diagnosis not present

## 2013-03-30 DIAGNOSIS — Z23 Encounter for immunization: Secondary | ICD-10-CM | POA: Diagnosis not present

## 2013-04-13 DIAGNOSIS — D801 Nonfamilial hypogammaglobulinemia: Secondary | ICD-10-CM | POA: Diagnosis not present

## 2013-04-13 DIAGNOSIS — N400 Enlarged prostate without lower urinary tract symptoms: Secondary | ICD-10-CM | POA: Diagnosis not present

## 2013-04-13 DIAGNOSIS — J387 Other diseases of larynx: Secondary | ICD-10-CM | POA: Diagnosis not present

## 2013-04-13 DIAGNOSIS — K219 Gastro-esophageal reflux disease without esophagitis: Secondary | ICD-10-CM | POA: Diagnosis not present

## 2013-04-13 DIAGNOSIS — N529 Male erectile dysfunction, unspecified: Secondary | ICD-10-CM | POA: Diagnosis not present

## 2013-04-13 DIAGNOSIS — C8589 Other specified types of non-Hodgkin lymphoma, extranodal and solid organ sites: Secondary | ICD-10-CM | POA: Diagnosis not present

## 2013-04-14 DIAGNOSIS — D801 Nonfamilial hypogammaglobulinemia: Secondary | ICD-10-CM | POA: Diagnosis not present

## 2013-04-14 DIAGNOSIS — J329 Chronic sinusitis, unspecified: Secondary | ICD-10-CM | POA: Diagnosis not present

## 2013-04-14 DIAGNOSIS — C8589 Other specified types of non-Hodgkin lymphoma, extranodal and solid organ sites: Secondary | ICD-10-CM | POA: Diagnosis not present

## 2013-04-14 DIAGNOSIS — J3489 Other specified disorders of nose and nasal sinuses: Secondary | ICD-10-CM | POA: Diagnosis not present

## 2013-04-14 DIAGNOSIS — K219 Gastro-esophageal reflux disease without esophagitis: Secondary | ICD-10-CM | POA: Diagnosis not present

## 2013-04-14 DIAGNOSIS — J387 Other diseases of larynx: Secondary | ICD-10-CM | POA: Diagnosis not present

## 2013-04-20 DIAGNOSIS — L821 Other seborrheic keratosis: Secondary | ICD-10-CM | POA: Diagnosis not present

## 2013-04-20 DIAGNOSIS — Z85828 Personal history of other malignant neoplasm of skin: Secondary | ICD-10-CM | POA: Diagnosis not present

## 2013-04-20 DIAGNOSIS — L570 Actinic keratosis: Secondary | ICD-10-CM | POA: Diagnosis not present

## 2013-04-21 DIAGNOSIS — Z7901 Long term (current) use of anticoagulants: Secondary | ICD-10-CM | POA: Diagnosis not present

## 2013-04-21 DIAGNOSIS — I495 Sick sinus syndrome: Secondary | ICD-10-CM | POA: Diagnosis not present

## 2013-04-21 DIAGNOSIS — I4891 Unspecified atrial fibrillation: Secondary | ICD-10-CM | POA: Diagnosis not present

## 2013-04-21 DIAGNOSIS — I359 Nonrheumatic aortic valve disorder, unspecified: Secondary | ICD-10-CM | POA: Diagnosis not present

## 2013-04-21 DIAGNOSIS — Z950 Presence of cardiac pacemaker: Secondary | ICD-10-CM | POA: Diagnosis not present

## 2013-04-21 DIAGNOSIS — I2510 Atherosclerotic heart disease of native coronary artery without angina pectoris: Secondary | ICD-10-CM | POA: Diagnosis not present

## 2013-05-11 DIAGNOSIS — J387 Other diseases of larynx: Secondary | ICD-10-CM | POA: Diagnosis not present

## 2013-05-11 DIAGNOSIS — C8589 Other specified types of non-Hodgkin lymphoma, extranodal and solid organ sites: Secondary | ICD-10-CM | POA: Diagnosis not present

## 2013-05-11 DIAGNOSIS — D801 Nonfamilial hypogammaglobulinemia: Secondary | ICD-10-CM | POA: Diagnosis not present

## 2013-05-11 DIAGNOSIS — K219 Gastro-esophageal reflux disease without esophagitis: Secondary | ICD-10-CM | POA: Diagnosis not present

## 2013-06-01 DIAGNOSIS — I4891 Unspecified atrial fibrillation: Secondary | ICD-10-CM | POA: Diagnosis not present

## 2013-06-01 DIAGNOSIS — I2510 Atherosclerotic heart disease of native coronary artery without angina pectoris: Secondary | ICD-10-CM | POA: Diagnosis not present

## 2013-06-01 DIAGNOSIS — I359 Nonrheumatic aortic valve disorder, unspecified: Secondary | ICD-10-CM | POA: Diagnosis not present

## 2013-06-01 DIAGNOSIS — M5137 Other intervertebral disc degeneration, lumbosacral region: Secondary | ICD-10-CM | POA: Diagnosis not present

## 2013-06-01 DIAGNOSIS — M999 Biomechanical lesion, unspecified: Secondary | ICD-10-CM | POA: Diagnosis not present

## 2013-06-01 DIAGNOSIS — I495 Sick sinus syndrome: Secondary | ICD-10-CM | POA: Diagnosis not present

## 2013-06-01 DIAGNOSIS — Z7901 Long term (current) use of anticoagulants: Secondary | ICD-10-CM | POA: Diagnosis not present

## 2013-06-01 DIAGNOSIS — Z950 Presence of cardiac pacemaker: Secondary | ICD-10-CM | POA: Diagnosis not present

## 2013-06-15 DIAGNOSIS — D801 Nonfamilial hypogammaglobulinemia: Secondary | ICD-10-CM | POA: Diagnosis not present

## 2013-06-16 DIAGNOSIS — J343 Hypertrophy of nasal turbinates: Secondary | ICD-10-CM | POA: Diagnosis not present

## 2013-06-16 DIAGNOSIS — J329 Chronic sinusitis, unspecified: Secondary | ICD-10-CM | POA: Diagnosis not present

## 2013-06-30 DIAGNOSIS — M999 Biomechanical lesion, unspecified: Secondary | ICD-10-CM | POA: Diagnosis not present

## 2013-06-30 DIAGNOSIS — M5137 Other intervertebral disc degeneration, lumbosacral region: Secondary | ICD-10-CM | POA: Diagnosis not present

## 2013-07-13 DIAGNOSIS — Z01818 Encounter for other preprocedural examination: Secondary | ICD-10-CM | POA: Diagnosis not present

## 2013-07-13 DIAGNOSIS — Z01810 Encounter for preprocedural cardiovascular examination: Secondary | ICD-10-CM | POA: Diagnosis not present

## 2013-07-13 DIAGNOSIS — D801 Nonfamilial hypogammaglobulinemia: Secondary | ICD-10-CM | POA: Diagnosis not present

## 2013-07-23 DIAGNOSIS — Z7901 Long term (current) use of anticoagulants: Secondary | ICD-10-CM | POA: Diagnosis not present

## 2013-07-23 DIAGNOSIS — I359 Nonrheumatic aortic valve disorder, unspecified: Secondary | ICD-10-CM | POA: Diagnosis not present

## 2013-07-23 DIAGNOSIS — I495 Sick sinus syndrome: Secondary | ICD-10-CM | POA: Diagnosis not present

## 2013-07-23 DIAGNOSIS — I4891 Unspecified atrial fibrillation: Secondary | ICD-10-CM | POA: Diagnosis not present

## 2013-07-23 DIAGNOSIS — Z950 Presence of cardiac pacemaker: Secondary | ICD-10-CM | POA: Diagnosis not present

## 2013-07-23 DIAGNOSIS — I2510 Atherosclerotic heart disease of native coronary artery without angina pectoris: Secondary | ICD-10-CM | POA: Diagnosis not present

## 2013-08-03 DIAGNOSIS — I4891 Unspecified atrial fibrillation: Secondary | ICD-10-CM | POA: Diagnosis not present

## 2013-08-03 DIAGNOSIS — R490 Dysphonia: Secondary | ICD-10-CM | POA: Diagnosis not present

## 2013-08-03 DIAGNOSIS — G4700 Insomnia, unspecified: Secondary | ICD-10-CM | POA: Diagnosis not present

## 2013-08-03 DIAGNOSIS — J329 Chronic sinusitis, unspecified: Secondary | ICD-10-CM | POA: Diagnosis not present

## 2013-08-03 DIAGNOSIS — D801 Nonfamilial hypogammaglobulinemia: Secondary | ICD-10-CM | POA: Diagnosis not present

## 2013-08-03 DIAGNOSIS — R011 Cardiac murmur, unspecified: Secondary | ICD-10-CM | POA: Diagnosis not present

## 2013-08-03 DIAGNOSIS — R0982 Postnasal drip: Secondary | ICD-10-CM | POA: Diagnosis not present

## 2013-08-03 DIAGNOSIS — J3489 Other specified disorders of nose and nasal sinuses: Secondary | ICD-10-CM | POA: Diagnosis not present

## 2013-08-03 DIAGNOSIS — C4490 Unspecified malignant neoplasm of skin, unspecified: Secondary | ICD-10-CM | POA: Diagnosis not present

## 2013-08-03 DIAGNOSIS — C8589 Other specified types of non-Hodgkin lymphoma, extranodal and solid organ sites: Secondary | ICD-10-CM | POA: Diagnosis not present

## 2013-08-03 DIAGNOSIS — D72819 Decreased white blood cell count, unspecified: Secondary | ICD-10-CM | POA: Diagnosis not present

## 2013-08-03 DIAGNOSIS — N400 Enlarged prostate without lower urinary tract symptoms: Secondary | ICD-10-CM | POA: Diagnosis not present

## 2013-08-03 DIAGNOSIS — I495 Sick sinus syndrome: Secondary | ICD-10-CM | POA: Diagnosis not present

## 2013-08-03 DIAGNOSIS — J343 Hypertrophy of nasal turbinates: Secondary | ICD-10-CM | POA: Diagnosis not present

## 2013-08-10 DIAGNOSIS — D801 Nonfamilial hypogammaglobulinemia: Secondary | ICD-10-CM | POA: Diagnosis not present

## 2013-08-11 DIAGNOSIS — J329 Chronic sinusitis, unspecified: Secondary | ICD-10-CM | POA: Diagnosis not present

## 2013-08-11 DIAGNOSIS — J343 Hypertrophy of nasal turbinates: Secondary | ICD-10-CM | POA: Diagnosis not present

## 2013-08-25 DIAGNOSIS — J329 Chronic sinusitis, unspecified: Secondary | ICD-10-CM | POA: Diagnosis not present

## 2013-08-25 DIAGNOSIS — J343 Hypertrophy of nasal turbinates: Secondary | ICD-10-CM | POA: Diagnosis not present

## 2013-09-07 DIAGNOSIS — D801 Nonfamilial hypogammaglobulinemia: Secondary | ICD-10-CM | POA: Diagnosis not present

## 2013-09-07 DIAGNOSIS — K219 Gastro-esophageal reflux disease without esophagitis: Secondary | ICD-10-CM | POA: Diagnosis not present

## 2013-09-07 DIAGNOSIS — C8589 Other specified types of non-Hodgkin lymphoma, extranodal and solid organ sites: Secondary | ICD-10-CM | POA: Diagnosis not present

## 2013-09-07 DIAGNOSIS — J387 Other diseases of larynx: Secondary | ICD-10-CM | POA: Diagnosis not present

## 2013-09-09 DIAGNOSIS — H04129 Dry eye syndrome of unspecified lacrimal gland: Secondary | ICD-10-CM | POA: Diagnosis not present

## 2013-10-05 DIAGNOSIS — J387 Other diseases of larynx: Secondary | ICD-10-CM | POA: Diagnosis not present

## 2013-10-05 DIAGNOSIS — D801 Nonfamilial hypogammaglobulinemia: Secondary | ICD-10-CM | POA: Diagnosis not present

## 2013-10-05 DIAGNOSIS — C8589 Other specified types of non-Hodgkin lymphoma, extranodal and solid organ sites: Secondary | ICD-10-CM | POA: Diagnosis not present

## 2013-10-06 DIAGNOSIS — M5137 Other intervertebral disc degeneration, lumbosacral region: Secondary | ICD-10-CM | POA: Diagnosis not present

## 2013-10-06 DIAGNOSIS — M999 Biomechanical lesion, unspecified: Secondary | ICD-10-CM | POA: Diagnosis not present

## 2013-10-07 DIAGNOSIS — D485 Neoplasm of uncertain behavior of skin: Secondary | ICD-10-CM | POA: Diagnosis not present

## 2013-10-07 DIAGNOSIS — C44319 Basal cell carcinoma of skin of other parts of face: Secondary | ICD-10-CM | POA: Diagnosis not present

## 2013-10-07 DIAGNOSIS — Z85828 Personal history of other malignant neoplasm of skin: Secondary | ICD-10-CM | POA: Diagnosis not present

## 2013-10-07 DIAGNOSIS — C4442 Squamous cell carcinoma of skin of scalp and neck: Secondary | ICD-10-CM | POA: Diagnosis not present

## 2013-10-07 DIAGNOSIS — L819 Disorder of pigmentation, unspecified: Secondary | ICD-10-CM | POA: Diagnosis not present

## 2013-10-07 DIAGNOSIS — L821 Other seborrheic keratosis: Secondary | ICD-10-CM | POA: Diagnosis not present

## 2013-10-21 DIAGNOSIS — J328 Other chronic sinusitis: Secondary | ICD-10-CM | POA: Diagnosis not present

## 2013-10-21 DIAGNOSIS — R490 Dysphonia: Secondary | ICD-10-CM | POA: Diagnosis not present

## 2013-10-21 DIAGNOSIS — R0982 Postnasal drip: Secondary | ICD-10-CM | POA: Diagnosis not present

## 2013-10-22 DIAGNOSIS — I4891 Unspecified atrial fibrillation: Secondary | ICD-10-CM | POA: Diagnosis not present

## 2013-10-22 DIAGNOSIS — Z950 Presence of cardiac pacemaker: Secondary | ICD-10-CM | POA: Diagnosis not present

## 2013-10-22 DIAGNOSIS — I495 Sick sinus syndrome: Secondary | ICD-10-CM | POA: Diagnosis not present

## 2013-10-22 DIAGNOSIS — I359 Nonrheumatic aortic valve disorder, unspecified: Secondary | ICD-10-CM | POA: Diagnosis not present

## 2013-10-22 DIAGNOSIS — Z7901 Long term (current) use of anticoagulants: Secondary | ICD-10-CM | POA: Diagnosis not present

## 2013-10-22 DIAGNOSIS — I2510 Atherosclerotic heart disease of native coronary artery without angina pectoris: Secondary | ICD-10-CM | POA: Diagnosis not present

## 2013-11-09 DIAGNOSIS — J328 Other chronic sinusitis: Secondary | ICD-10-CM | POA: Diagnosis not present

## 2013-11-09 DIAGNOSIS — D801 Nonfamilial hypogammaglobulinemia: Secondary | ICD-10-CM | POA: Diagnosis not present

## 2013-11-09 DIAGNOSIS — C8581 Other specified types of non-Hodgkin lymphoma, lymph nodes of head, face, and neck: Secondary | ICD-10-CM | POA: Diagnosis not present

## 2013-11-09 DIAGNOSIS — R490 Dysphonia: Secondary | ICD-10-CM | POA: Diagnosis not present

## 2013-11-09 DIAGNOSIS — C8589 Other specified types of non-Hodgkin lymphoma, extranodal and solid organ sites: Secondary | ICD-10-CM | POA: Diagnosis not present

## 2013-11-10 ENCOUNTER — Encounter (HOSPITAL_COMMUNITY): Payer: Self-pay | Admitting: Pharmacy Technician

## 2013-11-10 ENCOUNTER — Encounter: Payer: Self-pay | Admitting: Cardiology

## 2013-11-10 ENCOUNTER — Other Ambulatory Visit: Payer: Self-pay | Admitting: *Deleted

## 2013-11-10 ENCOUNTER — Telehealth: Payer: Self-pay | Admitting: *Deleted

## 2013-11-10 DIAGNOSIS — I4891 Unspecified atrial fibrillation: Secondary | ICD-10-CM | POA: Diagnosis not present

## 2013-11-10 DIAGNOSIS — Z7901 Long term (current) use of anticoagulants: Secondary | ICD-10-CM | POA: Insufficient documentation

## 2013-11-10 DIAGNOSIS — Z950 Presence of cardiac pacemaker: Secondary | ICD-10-CM | POA: Insufficient documentation

## 2013-11-10 DIAGNOSIS — I359 Nonrheumatic aortic valve disorder, unspecified: Secondary | ICD-10-CM | POA: Diagnosis not present

## 2013-11-10 DIAGNOSIS — C8590 Non-Hodgkin lymphoma, unspecified, unspecified site: Secondary | ICD-10-CM

## 2013-11-10 DIAGNOSIS — I2510 Atherosclerotic heart disease of native coronary artery without angina pectoris: Secondary | ICD-10-CM | POA: Insufficient documentation

## 2013-11-10 DIAGNOSIS — I495 Sick sinus syndrome: Secondary | ICD-10-CM | POA: Diagnosis not present

## 2013-11-10 DIAGNOSIS — I498 Other specified cardiac arrhythmias: Secondary | ICD-10-CM

## 2013-11-10 HISTORY — DX: Non-Hodgkin lymphoma, unspecified, unspecified site: C85.90

## 2013-11-10 HISTORY — DX: Atherosclerotic heart disease of native coronary artery without angina pectoris: I25.10

## 2013-11-10 NOTE — Telephone Encounter (Signed)
Patient at [facility] per Dr Thurman Coyer note from today's office visit.  Discussed with Dr Lovena Le, will plan to see patient in short stay prior to gen change.  Generator change scheduled for Friday 11-13-13 with Dr Lovena Le.  Pt to arrive at 11AM - pt to hold medications morning of procedure.  He is aware NPO after midnight Thursday night.    Patsey Berthold, RN, BSN 11/10/2013 3:43 PM

## 2013-11-10 NOTE — Progress Notes (Signed)
Patient ID: Mark Robinson, male   DOB: 04-30-1930, 80 y.o.   MRN: [id number]   Rodriques, Badie  Date of visit:  11/10/2013 DOB:  08/06/1929    Age:  80 yrs. Medical record number:  [id number]     Account number:  [id number] Primary Care Provider: Burnard Bunting A ____________________________ CURRENT DIAGNOSES  1. Arrhythmia-Atrial Fibrillation  2. Arrhythmia-Sick Sinus Syndrome  3. Pacemaker/cardiac insitu  4. Long-term (current) Use Of Anticoagulants  5. CAD,Native  6. Aortic Valve disorders  7. Other Malignant Lymphomas Involving Intra-abdominal Lymph Nodes ____________________________ ALLERGIES  No Known Allergies ____________________________ MEDICATIONS  1. Ambien 10 mg tablet, PRN  2. fluticasone 50 mcg/actuation Spray, Suspension, PRN  3. Levitra 20 mg Tablet, PRN  4. Claritin 10 mg tablet, PRN  5. potassium gluconate 550 mg (90 mg) tablet, PRN  6. Vitamin D3 2,000 unit tablet, PRN  7. Vitamin C 1,000 mg tablet, PRN  8. flecainide 150 mg tablet, BID  9. Eliquis 5 mg tablet, BID ____________________________ CHIEF COMPLAINTS  Followup of Arrhythmia-Atrial Fibrillation  Followup of Long-term (current) Use Of Anticoagulants ____________________________ HISTORY OF PRESENT ILLNESS Patient seen for cardiac followup. He has been doing well but needs to have some skin cancer treatments. He has also been treated for lymphoma. He denies any bleeding complications from [place]. He has been feeling well otherwise and denies shortness of breath, PND, orthopnea or edema. His EKG today showed a paced rhythm at 65 with A-V dissociation and when I interrogated his pacemaker in the office today he had reached ERI on April 23. His last pacemaker evaluation earlier in April did not show that he was ERI. ____________________________ PAST HISTORY  Past Medical Illnesses:  non-Hodgkin's Lyphoma 2001 with recurrence in 2006 treated with Vincristine, Cytoxan, and Prednisone and Rytuxan, diverticulosis;   Cardiovascular Illnesses:  atrial fibrillation 1996, sick sinus syndrome;  Surgical Procedures:  Bil ing hernia;  Cardiology Procedures-Invasive:  cardiac cath (left) December 1994, cardiac cath (left) October 2004, Medtronic pacemaker implant November 2007, insertion of new right ventricular lead for microperforation in November of 2007 by Dr. Lovena Le;  Cardiology Procedures-Noninvasive:  treadmill cardiolite December 2003, treadmill October 2007, echocardiogram March 2009, echocardiogram June 2012, lexiscan cardiolite November 2013;  Cardiac Cath Results:  no significant disease;  LVEF of 56% documented via nuclear study on 05/22/2012,  CHADS Score:  1,  CHA2DS2-VASC Score:  3 ____________________________ CARDIO-PULMONARY TEST DATES EKG Date:  11/10/2013;   Cardiac Cath Date:  04/19/2003;  Nuclear Study Date:  05/22/2012;  Echocardiography Date: 12/27/2010;   ____________________________ FAMILY HISTORY Father -- Father dead, Myocardial infarction, Diabetes mellitus Mother -- Mother dead, CVA, Myocardial infarction Sister -- Sister alive and well Brother -- Brother alive with problem, Diabetes mellitus ____________________________ SOCIAL HISTORY Alcohol Use:  drinks occasionally;  Smoking:  never smoked;  Diet:  regular diet without modifications;  Lifestyle:  married;  Exercise:  some exercise;  Occupation:  retired;  Residence:  lives with wife;  Job Description:  Clinical biochemist;   ____________________________ REVIEW OF SYSTEMS General:  denies recent weight change, fatique or change in exercise tolerance.  Integumentary:treatment for skin cancer Eyes: wears eye glasses/contact lenses Ears, Nose, Throat, Mouth:  hoarseness Respiratory: denies dyspnea, cough, wheezing or hemoptysis. Cardiovascular:  please review HPI  Genitourinary-Male: hesitancy  Musculoskeletal:  see HPI Neurological:  numbness of toes and fingers Psychiatric:  insomnia  ____________________________ PHYSICAL EXAMINATION VITAL  SIGNS  Blood Pressure:  126/80 Sitting, Right arm, regular cuff  , 120/80 Standing, Right arm and  regular cuff   Pulse:  68/min. Weight:  176.00 lbs. Height:  70"BMI: 25  Constitutional:  pleasant white male in no acute distress Skin:  skin cancer removal scars Head:  normocephalic, normal hair pattern, no masses or tenderness Neck:  supple, no masses, thyromegaly, JVD. Carotid pulses are full and equal bilaterally without bruits. Chest:  normal symmetry, clear to auscultation and percussion., healed pacemaker incision in the left pectoral area Cardiac:  normal S1 and S2, no S3 or S4, no murmurs,clicks, or rub heard,regular rhythm Peripheral Pulses:  the femoral,dorsalis pedis, and posterior tibial pulses are full and equal bilaterally with no bruits auscultated. Extremities & Back:  RLE venous varicosities, left upper leg varicosities, no edema present Neurological:  no gross motor or sensory deficits noted, affect appropriate, oriented x3. ____________________________ MOST RECENT LIPID PANEL 11/20/11  CHOL TOTL 204 mg/dl, LDL 133 calc, HDL 56 mg/dl, TRIGLYCER 75 mg/dl and CHOL/HDL 3.6 (Calc) ____________________________ IMPRESSIONS/PLAN  1. Pacemaker currently at [facility] to DDD 2. Paroxysmal atrial fibrillation suppressed with flecainide  3. Long-term anticoagulation with Eliquus  Recommendations:  I will set him up to see the EP physician to have his generator changed. Followup afterwards. He has a fairly busy schedule in June so I think we want to try to get this done in the next week or so. ____________________________ TODAYS ORDERS  1. 12 Lead EKG: Today  Paced rhythm with AV dissociation  2. Referral to EP                        ____________________________ Cardiology Physician:  Kerry Hough MD [facility]

## 2013-11-11 ENCOUNTER — Telehealth: Payer: Self-pay | Admitting: Internal Medicine

## 2013-11-11 NOTE — Telephone Encounter (Signed)
CAlled and went over instructions with patient's wife.  She verbalized understanding

## 2013-11-11 NOTE — Telephone Encounter (Signed)
New Message:  Pt's wife is calling to find out where and when her husband needs to arrive at on Friday. Also, which meds is the pt to take or not take. Wife is requesting a call back from nurse

## 2013-11-12 DIAGNOSIS — I4891 Unspecified atrial fibrillation: Secondary | ICD-10-CM | POA: Diagnosis not present

## 2013-11-12 DIAGNOSIS — I2510 Atherosclerotic heart disease of native coronary artery without angina pectoris: Secondary | ICD-10-CM | POA: Diagnosis not present

## 2013-11-12 DIAGNOSIS — Z45018 Encounter for adjustment and management of other part of cardiac pacemaker: Secondary | ICD-10-CM | POA: Diagnosis not present

## 2013-11-12 DIAGNOSIS — I442 Atrioventricular block, complete: Secondary | ICD-10-CM | POA: Diagnosis not present

## 2013-11-12 DIAGNOSIS — I495 Sick sinus syndrome: Secondary | ICD-10-CM | POA: Diagnosis not present

## 2013-11-12 DIAGNOSIS — Z7901 Long term (current) use of anticoagulants: Secondary | ICD-10-CM | POA: Diagnosis not present

## 2013-11-12 DIAGNOSIS — C8583 Other specified types of non-Hodgkin lymphoma, intra-abdominal lymph nodes: Secondary | ICD-10-CM | POA: Diagnosis not present

## 2013-11-12 MED ORDER — CHLORHEXIDINE GLUCONATE 4 % EX LIQD
60.0000 mL | Freq: Once | CUTANEOUS | Status: DC
  Filled 2013-11-12: qty 60

## 2013-11-12 MED ORDER — SODIUM CHLORIDE 0.9 % IR SOLN
80.0000 mg | Status: AC
  Filled 2013-11-12: qty 2

## 2013-11-12 MED ORDER — CEFAZOLIN SODIUM-DEXTROSE 2-3 GM-% IV SOLR
2.0000 g | INTRAVENOUS | Status: AC
  Filled 2013-11-12: qty 50

## 2013-11-12 MED ORDER — SODIUM CHLORIDE 0.9 % IV SOLN
INTRAVENOUS | Status: DC
  Administered 2013-11-13: 12:00:00 via INTRAVENOUS

## 2013-11-13 ENCOUNTER — Ambulatory Visit (HOSPITAL_COMMUNITY)
Admission: RE | Admit: 2013-11-13 | Discharge: 2013-11-13 | Disposition: A | Discharge status: Home / Self Care | Payer: Medicare Other | Source: Ambulatory Visit | Attending: Internal Medicine | Admitting: Internal Medicine

## 2013-11-13 ENCOUNTER — Encounter (HOSPITAL_COMMUNITY)
Admission: RE | Disposition: A | Discharge status: Home / Self Care | Payer: Self-pay | Source: Ambulatory Visit | Attending: Internal Medicine

## 2013-11-13 DIAGNOSIS — Z45018 Encounter for adjustment and management of other part of cardiac pacemaker: Secondary | ICD-10-CM | POA: Diagnosis not present

## 2013-11-13 DIAGNOSIS — I4891 Unspecified atrial fibrillation: Secondary | ICD-10-CM | POA: Insufficient documentation

## 2013-11-13 DIAGNOSIS — I498 Other specified cardiac arrhythmias: Secondary | ICD-10-CM

## 2013-11-13 DIAGNOSIS — C8583 Other specified types of non-Hodgkin lymphoma, intra-abdominal lymph nodes: Secondary | ICD-10-CM | POA: Diagnosis not present

## 2013-11-13 DIAGNOSIS — Z7901 Long term (current) use of anticoagulants: Secondary | ICD-10-CM | POA: Insufficient documentation

## 2013-11-13 DIAGNOSIS — I495 Sick sinus syndrome: Secondary | ICD-10-CM | POA: Insufficient documentation

## 2013-11-13 DIAGNOSIS — I2510 Atherosclerotic heart disease of native coronary artery without angina pectoris: Secondary | ICD-10-CM | POA: Insufficient documentation

## 2013-11-13 DIAGNOSIS — I442 Atrioventricular block, complete: Secondary | ICD-10-CM | POA: Insufficient documentation

## 2013-11-13 HISTORY — PX: PERMANENT PACEMAKER GENERATOR CHANGE: SHX6022

## 2013-11-13 LAB — BASIC METABOLIC PANEL
BUN: 20 mg/dL (ref 6–23)
CHLORIDE: 103 meq/L (ref 96–112)
CO2: 25 mEq/L (ref 19–32)
Calcium: 8.8 mg/dL (ref 8.4–10.5)
Creatinine, Ser: 0.93 mg/dL (ref 0.50–1.35)
GFR calc non Af Amer: 76 mL/min — ABNORMAL LOW (ref >90)
GFR, EST AFRICAN AMERICAN: 88 mL/min — AB (ref >90)
Glucose, Bld: 89 mg/dL (ref 70–99)
Potassium: 4.1 mEq/L (ref 3.7–5.3)
Sodium: 140 mEq/L (ref 137–147)

## 2013-11-13 LAB — CBC
HCT: 40.6 % (ref 39.0–52.0)
Hemoglobin: 13.4 g/dL (ref 13.0–17.0)
MCH: 31.5 pg (ref 26.0–34.0)
MCHC: 33 g/dL (ref 30.0–36.0)
MCV: 95.3 fL (ref 78.0–100.0)
PLATELETS: 162 10*3/uL (ref 150–400)
RBC: 4.26 MIL/uL (ref 4.22–5.81)
RDW: 14.2 % (ref 11.5–15.5)
WBC: 1.8 10*3/uL — AB (ref 4.0–10.5)

## 2013-11-13 LAB — SURGICAL PCR SCREEN
MRSA, PCR: NEGATIVE
Staphylococcus aureus: NEGATIVE

## 2013-11-13 SURGERY — PERMANENT PACEMAKER GENERATOR CHANGE
Anesthesia: LOCAL

## 2013-11-13 MED ORDER — FENTANYL CITRATE 0.05 MG/ML IJ SOLN
INTRAMUSCULAR | Status: AC
  Filled 2013-11-13: qty 2

## 2013-11-13 MED ORDER — MUPIROCIN 2 % EX OINT
TOPICAL_OINTMENT | CUTANEOUS | Status: AC
  Administered 2013-11-13: 1 via NASAL
  Filled 2013-11-13: qty 22

## 2013-11-13 MED ORDER — MIDAZOLAM HCL 5 MG/5ML IJ SOLN
INTRAMUSCULAR | Status: AC
  Filled 2013-11-13: qty 5

## 2013-11-13 MED ORDER — HEPARIN (PORCINE) IN NACL 2-0.9 UNIT/ML-% IJ SOLN
INTRAMUSCULAR | Status: AC
  Filled 2013-11-13: qty 1000

## 2013-11-13 MED ORDER — LIDOCAINE HCL (PF) 1 % IJ SOLN
INTRAMUSCULAR | Status: AC
  Filled 2013-11-13: qty 60

## 2013-11-13 MED ORDER — MUPIROCIN 2 % EX OINT
TOPICAL_OINTMENT | Freq: Two times a day (BID) | CUTANEOUS | Status: DC
  Administered 2013-11-13: 1 via NASAL
  Filled 2013-11-13: qty 22

## 2013-11-13 NOTE — Discharge Instructions (Signed)
Pacemaker Battery Change, Care After  °Refer to this sheet in the next few weeks. These instructions provide you with information on caring for yourself after your procedure. Your health care provider may also give you more specific instructions. Your treatment has been planned according to current medical practices, but problems sometimes occur. Call your health care provider if you have any problems or questions after your procedure. °WHAT TO EXPECT AFTER THE PROCEDURE °After your procedure, it is typical to have the following sensations: °· Soreness at the pacemaker site. °HOME CARE INSTRUCTIONS  °· Keep the incision clean and dry. °· Unless advised otherwise, you may shower beginning 48 hours after your procedure. °· For the first week after the replacement, avoid stretching motions that pull at the incision site and avoid heavy exercise with the arm on the same side as the incision. °· Only take over-the-counter or prescription medicines for pain, discomfort, or fever as directed by your health care provider. °· Your health care provider will tell you when you will need to next test your pacemaker by telephone or when to return to the office for follow up for removal of stitches. °SEEK MEDICAL CARE IF:  °· You have pain at the incision site that is not relieved by over-the-counter or prescription medicine. °· There is drainage or pus from the incision site. °· There is swelling larger than a lime at the incision site. °· You develop red streaking that extends above or below the incision site. °· You feel brief, intermittent palpitations, lightheadedness, or any symptoms that you feel might be related to your heart. °SEEK IMMEDIATE MEDICAL CARE IF:  °· You experience chest pain that is different than the pain at the pacemaker site. °· Shortness of breath. °· Palpitations or irregular heart beat. °· Lightheadedness that does not go away quickly. °· Fainting. °· You have pain that gets worse and is not relieved by  medicine. °MAKE SURE YOU:  °· Understand these instructions. °· Will watch your condition. °· Will get help right away if you are not doing well or get worse. °Document Released: 04/15/2013 Document Reviewed: 01/07/2013 °[employer]® Patient Information ©2014 [employer]. ° °

## 2013-11-13 NOTE — Interval H&P Note (Signed)
History and Physical Interval Note:  11/13/2013 1:23 PM  Mark Robinson  has presented today for surgery, with the diagnosis of bradicardia  The various methods of treatment have been discussed with the patient and family. After consideration of risks, benefits and other options for treatment, the patient has consented to  Procedure(s): PERMANENT PACEMAKER GENERATOR CHANGE (N/A) as a surgical intervention .  The patient's history has been reviewed, patient examined, no change in status, stable for surgery.  I have reviewed the patient's chart and labs.  Questions were answered to the patient's satisfaction.     Evans Lance

## 2013-11-13 NOTE — H&P (View-Only) (Signed)
ELECTROPHYSIOLOGY CONSULT NOTE    Patient ID: Mark Robinson MRN: [id number], DOB/AGE: 1930/01/12 80 y.o.  Date of Consult: 11-13-2013  Primary Physician: Geoffery Lyons, MD Primary Cardiologist: Wynonia Lawman Electrophysiologist: Lovena Le  Reason for Consultation: pacemaker at elective replacement indicator  HPI:  Mark Robinson is a 80 y.o. male with a past medical history CAD, atrial fibrillation (on Eliquis), lymphoma and SSS status post pacemaker implant 2007.  He has been followed by Dr Wynonia Lawman and his device has reached elective replacement indicator.  He is referred today for pacemaker generator change.    Echocardiogram 2014 demonstrates EF 55%.   Device interrogation today is reviewed - patient is dependent to 30.  Pt is interested in MRI compatibility.  Existing leads have now been FDA approved for MRI when used with MRI compatible pacemaker.   He denies recent chest pain, shortness of breath, LE edema, palpitations, syncope, or recent fevers or chills.  ROS is otherwise negative.    Past Medical History  Diagnosis Date  . Pacemaker   . 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 CAD (coronary artery disease), native coronary artery 11/10/2013  . Paroxsymal atrial fibrillation     On flecainide CHADS2VASC score 4   . Cardiac pacemaker in situ     05/23/2006 Indications Tachy brady with PAF  Medtronic EnRhythm model P1501DR   Seral number  [id number] H 05/29/2006 (repositioned because of increasing thresholds and concern for microperforation) RA lead Medtronic 5076 serial number [id number] RV lead  Medtronic 5076  serial number [id number],  (repositioned on septumfor microperforation)    . Long-term (current) use of anticoagulants      Surgical History:  Past Surgical History  Procedure Laterality Date  . Tonsillectomy    . Cardiac catheterization    . Appendectomy    . Insert / replace / remove pacemaker    . Hernia repair    . Foreign body removal  05/23/2011    Procedure: FOREIGN BODY REMOVAL ADULT;  Surgeon: Wynonia Sours, MD;  Location: [place];  Service: Orthopedics;  Laterality: Left;  left index finger  . Lesion removal  05/23/2011    Procedure: LESION REMOVAL;  Surgeon: Wynonia Sours, MD;  Location: [place];  Service: Orthopedics;  Laterality: Left;  excision open lesion left index finger     Prescriptions prior to admission  Medication Sig Dispense Refill  . apixaban (ELIQUIS) 5 MG TABS tablet Take 5 mg by mouth 2 (two) times daily.      . flecainide (TAMBOCOR) 150 MG tablet Take 150 mg by mouth 2 (two) times daily.        . fluticasone (FLONASE) 50 MCG/ACT nasal spray Place 1-2 sprays into both nostrils daily.        Inpatient Medications:  .  ceFAZolin (ANCEF) IV  2 g Intravenous On Call  . chlorhexidine  60 mL Topical Once  . gentamicin irrigation  80 mg Irrigation On Call  . mupirocin ointment        Allergies: No Known Allergies  History   Social History  . Marital Status: Married    Spouse Name: N/A  Number of Children: N/A  . Years of Education: N/A   Occupational History  . Not on file.   Social History Main Topics  . Smoking status: Never Smoker   . Smokeless tobacco: Never Used  . Alcohol Use: Yes  . Drug Use: No  . Sexual Activity: Not on file   Other Topics Concern  . Not on file   Social History Narrative   Retired Pharmacist, community.     Family Hx - CVA, DM, CAD  Physical Exam Well appearing 80 yo man, NAD HEENT: Unremarkable,[place], AT Neck:  5JVD, no thyromegally Back:  No CVA tenderness Lungs:  Clear with no  wheezes, rales, or rhonchi HEART:  Regular rate rhythm, no murmurs, no rubs, no clicks Abd:  soft, positive bowel sounds, no organomegally, no rebound, no guarding Ext:  2 plus pulses, no edema, no cyanosis, no clubbing Skin:  No rashes no nodules Neuro:  CN II through XII intact, motor grossly intact  Labs:   Lab Results  Component Value Date   HGB 11.5* 05/23/2011   No results found for this basename: NA, K, CL, CO2, BUN, CREATININE, CALCIUM, LABALBU, PROT, BILITOT, ALKPHOS, ALT, AST, GLUCOSE,  in the last 168 hours   EKG:nsr with ventricular pacing  DEVICE HISTORY: MDT EnRhythm dual chamber pacemaker implanted 2007 by Dr Lovena Le for SSS   A/P 1. CHB 2. S/p PPM 3. Old device at ERI Rec: I have discussed the treatment options with the patient and his wife and the risks/benefits/goals/expectations of PPM generator change have been discussed with the patient and he wishes to proceed.  Mikle Bosworth.D.

## 2013-11-13 NOTE — Consult Note (Signed)
ELECTROPHYSIOLOGY CONSULT NOTE    Patient ID: Mark Robinson MRN: [id number], DOB/AGE: 1930/01/12 80 y.o.  Date of Consult: 11-13-2013  Primary Physician: Geoffery Lyons, MD Primary Cardiologist: Wynonia Lawman Electrophysiologist: Lovena Le  Reason for Consultation: pacemaker at elective replacement indicator  HPI:  Mark Robinson is a 80 y.o. male with a past medical history CAD, atrial fibrillation (on Eliquis), lymphoma and SSS status post pacemaker implant 2007.  He has been followed by Dr Wynonia Lawman and his device has reached elective replacement indicator.  He is referred today for pacemaker generator change.    Echocardiogram 2014 demonstrates EF 55%.   Device interrogation today is reviewed - patient is dependent to 30.  Pt is interested in MRI compatibility.  Existing leads have now been FDA approved for MRI when used with MRI compatible pacemaker.   He denies recent chest pain, shortness of breath, LE edema, palpitations, syncope, or recent fevers or chills.  ROS is otherwise negative.    Past Medical History  Diagnosis Date  . Pacemaker   . 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 CAD (coronary artery disease), native coronary artery 11/10/2013  . Paroxsymal atrial fibrillation     On flecainide CHADS2VASC score 4   . Cardiac pacemaker in situ     05/23/2006 Indications Tachy brady with PAF  Medtronic EnRhythm model P1501DR   Seral number  [id number] H 05/29/2006 (repositioned because of increasing thresholds and concern for microperforation) RA lead Medtronic 5076 serial number [id number] RV lead  Medtronic 5076  serial number [id number],  (repositioned on septumfor microperforation)    . Long-term (current) use of anticoagulants      Surgical History:  Past Surgical History  Procedure Laterality Date  . Tonsillectomy    . Cardiac catheterization    . Appendectomy    . Insert / replace / remove pacemaker    . Hernia repair    . Foreign body removal  05/23/2011    Procedure: FOREIGN BODY REMOVAL ADULT;  Surgeon: Wynonia Sours, MD;  Location: [place];  Service: Orthopedics;  Laterality: Left;  left index finger  . Lesion removal  05/23/2011    Procedure: LESION REMOVAL;  Surgeon: Wynonia Sours, MD;  Location: [place];  Service: Orthopedics;  Laterality: Left;  excision open lesion left index finger     Prescriptions prior to admission  Medication Sig Dispense Refill  . apixaban (ELIQUIS) 5 MG TABS tablet Take 5 mg by mouth 2 (two) times daily.      . flecainide (TAMBOCOR) 150 MG tablet Take 150 mg by mouth 2 (two) times daily.        . fluticasone (FLONASE) 50 MCG/ACT nasal spray Place 1-2 sprays into both nostrils daily.        Inpatient Medications:  .  ceFAZolin (ANCEF) IV  2 g Intravenous On Call  . chlorhexidine  60 mL Topical Once  . gentamicin irrigation  80 mg Irrigation On Call  . mupirocin ointment        Allergies: No Known Allergies  History   Social History  . Marital Status: Married    Spouse Name: N/A  Number of Children: N/A  . Years of Education: N/A   Occupational History  . Not on file.   Social History Main Topics  . Smoking status: Never Smoker   . Smokeless tobacco: Never Used  . Alcohol Use: Yes  . Drug Use: No  . Sexual Activity: Not on file   Other Topics Concern  . Not on file   Social History Narrative   Retired Pharmacist, community.     Family Hx - CVA, DM, CAD  Physical Exam Well appearing 80 yo man, NAD HEENT: Unremarkable,[place], AT Neck:  5JVD, no thyromegally Back:  No CVA tenderness Lungs:  Clear with no  wheezes, rales, or rhonchi HEART:  Regular rate rhythm, no murmurs, no rubs, no clicks Abd:  soft, positive bowel sounds, no organomegally, no rebound, no guarding Ext:  2 plus pulses, no edema, no cyanosis, no clubbing Skin:  No rashes no nodules Neuro:  CN II through XII intact, motor grossly intact  Labs:   Lab Results  Component Value Date   HGB 11.5* 05/23/2011   No results found for this basename: NA, K, CL, CO2, BUN, CREATININE, CALCIUM, LABALBU, PROT, BILITOT, ALKPHOS, ALT, AST, GLUCOSE,  in the last 168 hours   EKG:nsr with ventricular pacing  DEVICE HISTORY: MDT EnRhythm dual chamber pacemaker implanted 2007 by Dr Lovena Le for SSS   A/P 1. CHB 2. S/p PPM 3. Old device at ERI Rec: I have discussed the treatment options with the patient and his wife and the risks/benefits/goals/expectations of PPM generator change have been discussed with the patient and he wishes to proceed.  Mikle Bosworth.D.

## 2013-11-13 NOTE — CV Procedure (Signed)
EP Procedure Note  Procedure: Removal of a DDD PM which had reached ERI and insertion of a DDD PPM  Indication: symptomatic complete heart block, s/p PPM insertion which had reached ERI  Findings: After informed consent was obtained, the patient was taken to the diagnostic electrophysiology laboratory in the fasting state. After the usual preparation and draping, intravenous Versed and fentanyl were given for sedation. 30 cc of lidocaine was infiltrated into the left infraclavicular region near the pacemaker insertion site. A 5 cm incision was carried out. Electrocautery was utilized to dissect down to the pacemaker pocket. The old Medtronic pacemaker was removed. The atrial lead was evaluated, found to be working satisfactorily. The ventricular lead was disconnected and the pacing threshold was around 1.1 V at 0.5 ms. The pacing impedance was 453 ohms. The new Medtronic dual-chamber pacemaker,Advisa DR MRI Surescan, [id number] was connected to the old pacing leads in placed back in the subcutaneous pocket. The pocket was irrigated with antibiotic irrigation. The incision was closed with 2 layers of Vicryl suture. Benzoin and Steri-Strips for pain on the skin. The patient was returned to his room in satisfactory condition.  Complications: There were no immediate procedure complications  Conclusion: Successful removal of a previously implanted Medtronic dual-chamber pacemaker which had reached elective replacement, and insertion of a new Medtronic dual-chamber pacemaker in a patient with underlying complete heart block.  Cristopher Peru, M.D.

## 2013-11-16 DIAGNOSIS — C4442 Squamous cell carcinoma of skin of scalp and neck: Secondary | ICD-10-CM | POA: Diagnosis not present

## 2013-11-16 DIAGNOSIS — L905 Scar conditions and fibrosis of skin: Secondary | ICD-10-CM | POA: Diagnosis not present

## 2013-11-16 DIAGNOSIS — C44319 Basal cell carcinoma of skin of other parts of face: Secondary | ICD-10-CM | POA: Diagnosis not present

## 2013-11-16 DIAGNOSIS — D485 Neoplasm of uncertain behavior of skin: Secondary | ICD-10-CM | POA: Diagnosis not present

## 2013-11-16 DIAGNOSIS — C44320 Squamous cell carcinoma of skin of unspecified parts of face: Secondary | ICD-10-CM | POA: Diagnosis not present

## 2013-11-16 DIAGNOSIS — L821 Other seborrheic keratosis: Secondary | ICD-10-CM | POA: Diagnosis not present

## 2013-11-25 ENCOUNTER — Ambulatory Visit (INDEPENDENT_AMBULATORY_CARE_PROVIDER_SITE_OTHER): Payer: Medicare Other | Admitting: *Deleted

## 2013-11-25 ENCOUNTER — Encounter: Payer: Self-pay | Admitting: Internal Medicine

## 2013-11-25 DIAGNOSIS — Z950 Presence of cardiac pacemaker: Secondary | ICD-10-CM

## 2013-11-25 DIAGNOSIS — E785 Hyperlipidemia, unspecified: Secondary | ICD-10-CM | POA: Diagnosis not present

## 2013-11-25 DIAGNOSIS — C8589 Other specified types of non-Hodgkin lymphoma, extranodal and solid organ sites: Secondary | ICD-10-CM | POA: Diagnosis not present

## 2013-11-25 DIAGNOSIS — I4891 Unspecified atrial fibrillation: Secondary | ICD-10-CM | POA: Diagnosis not present

## 2013-11-25 DIAGNOSIS — Z125 Encounter for screening for malignant neoplasm of prostate: Secondary | ICD-10-CM | POA: Diagnosis not present

## 2013-11-25 DIAGNOSIS — Z79899 Other long term (current) drug therapy: Secondary | ICD-10-CM | POA: Diagnosis not present

## 2013-11-25 LAB — MDC_IDC_ENUM_SESS_TYPE_INCLINIC

## 2013-11-25 NOTE — Progress Notes (Signed)
Wound check appointment. Steri-strips removed. Wound without redness or edema. Incision edges approximated, wound well healed. Normal device function. Thresholds, sensing, and impedances consistent with implant measurements. Device programmed at chronic lead settings. Histogram distribution appropriate for patient and level of activity. 1 AT/AF episode---95 sec, pace-terminated x1ATP + eliquis. No high ventricular rates noted. Patient educated about wound care, arm mobility, lifting restrictions. ROV w/ GT 03/09/14. Dr. Wynonia Lawman to follow [facility].

## 2013-11-26 DIAGNOSIS — C44320 Squamous cell carcinoma of skin of unspecified parts of face: Secondary | ICD-10-CM | POA: Diagnosis not present

## 2013-12-02 DIAGNOSIS — L570 Actinic keratosis: Secondary | ICD-10-CM | POA: Diagnosis not present

## 2013-12-02 DIAGNOSIS — I4891 Unspecified atrial fibrillation: Secondary | ICD-10-CM | POA: Diagnosis not present

## 2013-12-02 DIAGNOSIS — E785 Hyperlipidemia, unspecified: Secondary | ICD-10-CM | POA: Diagnosis not present

## 2013-12-02 DIAGNOSIS — Z7901 Long term (current) use of anticoagulants: Secondary | ICD-10-CM | POA: Diagnosis not present

## 2013-12-02 DIAGNOSIS — Z125 Encounter for screening for malignant neoplasm of prostate: Secondary | ICD-10-CM | POA: Diagnosis not present

## 2013-12-02 DIAGNOSIS — Z0000 Encounter for general adult medical examination without abnormal findings: Secondary | ICD-10-CM | POA: Diagnosis not present

## 2013-12-02 DIAGNOSIS — R7301 Impaired fasting glucose: Secondary | ICD-10-CM | POA: Diagnosis not present

## 2013-12-02 DIAGNOSIS — Z1212 Encounter for screening for malignant neoplasm of rectum: Secondary | ICD-10-CM | POA: Diagnosis not present

## 2013-12-02 DIAGNOSIS — C8589 Other specified types of non-Hodgkin lymphoma, extranodal and solid organ sites: Secondary | ICD-10-CM | POA: Diagnosis not present

## 2013-12-02 DIAGNOSIS — G609 Hereditary and idiopathic neuropathy, unspecified: Secondary | ICD-10-CM | POA: Diagnosis not present

## 2013-12-02 DIAGNOSIS — Z6825 Body mass index (BMI) 25.0-25.9, adult: Secondary | ICD-10-CM | POA: Diagnosis not present

## 2013-12-02 DIAGNOSIS — Z1331 Encounter for screening for depression: Secondary | ICD-10-CM | POA: Diagnosis not present

## 2013-12-08 DIAGNOSIS — M999 Biomechanical lesion, unspecified: Secondary | ICD-10-CM | POA: Diagnosis not present

## 2013-12-08 DIAGNOSIS — M5137 Other intervertebral disc degeneration, lumbosacral region: Secondary | ICD-10-CM | POA: Diagnosis not present

## 2014-01-12 DIAGNOSIS — M999 Biomechanical lesion, unspecified: Secondary | ICD-10-CM | POA: Diagnosis not present

## 2014-01-12 DIAGNOSIS — M5137 Other intervertebral disc degeneration, lumbosacral region: Secondary | ICD-10-CM | POA: Diagnosis not present

## 2014-01-13 DIAGNOSIS — R059 Cough, unspecified: Secondary | ICD-10-CM | POA: Diagnosis not present

## 2014-01-13 DIAGNOSIS — J3489 Other specified disorders of nose and nasal sinuses: Secondary | ICD-10-CM | POA: Diagnosis not present

## 2014-01-13 DIAGNOSIS — Z87898 Personal history of other specified conditions: Secondary | ICD-10-CM | POA: Diagnosis not present

## 2014-01-13 DIAGNOSIS — G4700 Insomnia, unspecified: Secondary | ICD-10-CM | POA: Diagnosis not present

## 2014-01-13 DIAGNOSIS — J329 Chronic sinusitis, unspecified: Secondary | ICD-10-CM | POA: Diagnosis not present

## 2014-01-13 DIAGNOSIS — J339 Nasal polyp, unspecified: Secondary | ICD-10-CM | POA: Diagnosis not present

## 2014-01-13 DIAGNOSIS — D801 Nonfamilial hypogammaglobulinemia: Secondary | ICD-10-CM | POA: Diagnosis not present

## 2014-01-13 DIAGNOSIS — J387 Other diseases of larynx: Secondary | ICD-10-CM | POA: Diagnosis not present

## 2014-01-13 DIAGNOSIS — H9190 Unspecified hearing loss, unspecified ear: Secondary | ICD-10-CM | POA: Diagnosis not present

## 2014-01-13 DIAGNOSIS — Z950 Presence of cardiac pacemaker: Secondary | ICD-10-CM | POA: Diagnosis not present

## 2014-01-13 DIAGNOSIS — R490 Dysphonia: Secondary | ICD-10-CM | POA: Diagnosis not present

## 2014-01-13 DIAGNOSIS — R0982 Postnasal drip: Secondary | ICD-10-CM | POA: Diagnosis not present

## 2014-01-20 DIAGNOSIS — I495 Sick sinus syndrome: Secondary | ICD-10-CM | POA: Diagnosis not present

## 2014-01-20 DIAGNOSIS — Z7901 Long term (current) use of anticoagulants: Secondary | ICD-10-CM | POA: Diagnosis not present

## 2014-01-20 DIAGNOSIS — I2510 Atherosclerotic heart disease of native coronary artery without angina pectoris: Secondary | ICD-10-CM | POA: Diagnosis not present

## 2014-01-20 DIAGNOSIS — Z950 Presence of cardiac pacemaker: Secondary | ICD-10-CM | POA: Diagnosis not present

## 2014-01-20 DIAGNOSIS — I4891 Unspecified atrial fibrillation: Secondary | ICD-10-CM | POA: Diagnosis not present

## 2014-01-20 DIAGNOSIS — I359 Nonrheumatic aortic valve disorder, unspecified: Secondary | ICD-10-CM | POA: Diagnosis not present

## 2014-03-08 DIAGNOSIS — M5137 Other intervertebral disc degeneration, lumbosacral region: Secondary | ICD-10-CM | POA: Diagnosis not present

## 2014-03-08 DIAGNOSIS — M999 Biomechanical lesion, unspecified: Secondary | ICD-10-CM | POA: Diagnosis not present

## 2014-03-09 ENCOUNTER — Ambulatory Visit (INDEPENDENT_AMBULATORY_CARE_PROVIDER_SITE_OTHER): Payer: Medicare Other | Admitting: Internal Medicine

## 2014-03-09 ENCOUNTER — Encounter: Payer: Self-pay | Admitting: Internal Medicine

## 2014-03-09 VITALS — BP 136/80 | HR 68 | Ht 71.0 in | Wt 172.0 lb

## 2014-03-09 DIAGNOSIS — I480 Paroxysmal atrial fibrillation: Secondary | ICD-10-CM

## 2014-03-09 DIAGNOSIS — Z950 Presence of cardiac pacemaker: Secondary | ICD-10-CM | POA: Diagnosis not present

## 2014-03-09 DIAGNOSIS — J339 Nasal polyp, unspecified: Secondary | ICD-10-CM | POA: Diagnosis not present

## 2014-03-09 DIAGNOSIS — I4891 Unspecified atrial fibrillation: Secondary | ICD-10-CM

## 2014-03-09 DIAGNOSIS — R0982 Postnasal drip: Secondary | ICD-10-CM | POA: Diagnosis not present

## 2014-03-09 DIAGNOSIS — J328 Other chronic sinusitis: Secondary | ICD-10-CM | POA: Diagnosis not present

## 2014-03-09 LAB — MDC_IDC_ENUM_SESS_TYPE_INCLINIC
Battery Remaining Longevity: 87 mo
Battery Voltage: 3.02 V
Brady Statistic AP VP Percent: 97.31 %
Brady Statistic AP VS Percent: 0.1 %
Brady Statistic AS VS Percent: 0.14 %
Brady Statistic RA Percent Paced: 97.4 %
Date Time Interrogation Session: 20150901105310
Lead Channel Impedance Value: 361 Ohm
Lead Channel Impedance Value: 399 Ohm
Lead Channel Impedance Value: 456 Ohm
Lead Channel Impedance Value: 456 Ohm
Lead Channel Pacing Threshold Amplitude: 1 V
Lead Channel Pacing Threshold Pulse Width: 0.4 ms
Lead Channel Sensing Intrinsic Amplitude: 2.375 mV
Lead Channel Setting Pacing Amplitude: 2 V
Lead Channel Setting Pacing Amplitude: 2.5 V
Lead Channel Setting Pacing Pulse Width: 0.7 ms
Lead Channel Setting Sensing Sensitivity: 2.8 mV
MDC IDC MSMT LEADCHNL RA PACING THRESHOLD PULSEWIDTH: 0.4 ms
MDC IDC MSMT LEADCHNL RV PACING THRESHOLD AMPLITUDE: 1.25 V
MDC IDC MSMT LEADCHNL RV SENSING INTR AMPL: 19.125 mV
MDC IDC SET ZONE DETECTION INTERVAL: 400 ms
MDC IDC STAT BRADY AS VP PERCENT: 2.45 %
MDC IDC STAT BRADY RV PERCENT PACED: 99.76 %
Zone Setting Detection Interval: 370 ms

## 2014-03-09 NOTE — Assessment & Plan Note (Signed)
He is maintaining NSR on flecainide.

## 2014-03-09 NOTE — Progress Notes (Signed)
HPI [name] returns today for followup. He is an 80 yo man with a h/o symptomatic bradycardia due to sinus node dysfunction as well as heart block. He is s/p PPM insertion. He denies chest pain, sob, or syncope. He has undergone sinus surgery. No Known Allergies   Current Outpatient Prescriptions  Medication Sig Dispense Refill  . apixaban (ELIQUIS) 5 MG TABS tablet Take 5 mg by mouth 2 (two) times daily.      . flecainide (TAMBOCOR) 150 MG tablet Take 150 mg by mouth 2 (two) times daily.        . fluticasone (FLONASE) 50 MCG/ACT nasal spray Place 1-2 sprays into both nostrils daily.       No current facility-administered medications for this visit.     Past Medical History  Diagnosis Date  . Pacemaker   . 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 CAD (coronary artery disease), native coronary artery 11/10/2013  . Paroxsymal atrial fibrillation     On flecainide CHADS2VASC score 4   . Cardiac pacemaker in situ     05/23/2006 Indications Tachy brady with PAF  Medtronic EnRhythm model P1501DR   Seral number  [id number] H 05/29/2006 (repositioned because of increasing thresholds and concern for microperforation) RA lead Medtronic 5076 serial number [id number] RV lead  Medtronic 5076 serial number [id number],  (repositioned on septumfor microperforation)    . Long-term (current) use of anticoagulants     ROS:   All systems reviewed and negative except as noted in the HPI.   Past Surgical History  Procedure Laterality Date  . Tonsillectomy    . Cardiac catheterization    . Appendectomy    . Insert /  replace / remove pacemaker    . Hernia repair    . Foreign body removal  05/23/2011    Procedure: FOREIGN BODY REMOVAL ADULT;  Surgeon: Wynonia Sours, MD;  Location: [place];  Service: Orthopedics;  Laterality: Left;  left index finger  . Lesion removal  05/23/2011    Procedure: LESION REMOVAL;  Surgeon: Wynonia Sours, MD;  Location: [place];  Service: Orthopedics;  Laterality: Left;  excision open lesion left index finger     History reviewed. No pertinent family history.   History   Social History  . Marital Status: Married    Spouse Name: N/A    Number of Children: N/A  . Years of Education: N/A   Occupational History  . Not on file.   Social History Main Topics  . Smoking status: Never Smoker   . Smokeless tobacco: Never Used  . Alcohol Use: Yes  . Drug Use: No  . Sexual Activity: Not on file   Other Topics Concern  . Not on file   Social History Narrative   Retired Pharmacist, community.     BP 136/80  Pulse 68  Ht 5\' 11"  (1.803 m)  Wt 172 lb (78.019 kg)  BMI 24.00 kg/m2  Physical Exam:  Well appearing NAD HEENT: Unremarkable Neck:  No JVD, no thyromegally Lymphatics:  No adenopathy Back:  No CVA tenderness Lungs:  Clear with no wheezes HEART:  Regular  rate rhythm, no murmurs, no rubs, no clicks Abd:  soft, positive bowel sounds, no organomegally, no rebound, no guarding Ext:  2 plus pulses, no edema, no cyanosis, no clubbing Skin:  No rashes no nodules Neuro:  CN II through XII intact, motor grossly intact  EKG -AV paced  DEVICE  Normal device function.  See PaceArt for details.   Assess/Plan:

## 2014-03-09 NOTE — Patient Instructions (Signed)
Your physician recommends that you schedule a follow-up appointment with Dr. Wynonia Lawman

## 2014-03-09 NOTE — Assessment & Plan Note (Signed)
His Medtronic DDD MRI PM is working normally. Will recheck in several months.

## 2014-03-10 DIAGNOSIS — D801 Nonfamilial hypogammaglobulinemia: Secondary | ICD-10-CM | POA: Diagnosis not present

## 2014-03-10 DIAGNOSIS — C8589 Other specified types of non-Hodgkin lymphoma, extranodal and solid organ sites: Secondary | ICD-10-CM | POA: Diagnosis not present

## 2014-03-16 DIAGNOSIS — Z85828 Personal history of other malignant neoplasm of skin: Secondary | ICD-10-CM | POA: Diagnosis not present

## 2014-03-16 DIAGNOSIS — D485 Neoplasm of uncertain behavior of skin: Secondary | ICD-10-CM | POA: Diagnosis not present

## 2014-03-16 DIAGNOSIS — D1801 Hemangioma of skin and subcutaneous tissue: Secondary | ICD-10-CM | POA: Diagnosis not present

## 2014-03-16 DIAGNOSIS — C44621 Squamous cell carcinoma of skin of unspecified upper limb, including shoulder: Secondary | ICD-10-CM | POA: Diagnosis not present

## 2014-03-16 DIAGNOSIS — L821 Other seborrheic keratosis: Secondary | ICD-10-CM | POA: Diagnosis not present

## 2014-03-16 DIAGNOSIS — L570 Actinic keratosis: Secondary | ICD-10-CM | POA: Diagnosis not present

## 2014-03-17 ENCOUNTER — Encounter: Payer: Self-pay | Admitting: Internal Medicine

## 2014-04-13 DIAGNOSIS — M4806 Spinal stenosis, lumbar region: Secondary | ICD-10-CM | POA: Diagnosis not present

## 2014-04-13 DIAGNOSIS — M545 Low back pain: Secondary | ICD-10-CM | POA: Diagnosis not present

## 2014-04-13 DIAGNOSIS — S33140A Subluxation of L4/L5 lumbar vertebra, initial encounter: Secondary | ICD-10-CM | POA: Diagnosis not present

## 2014-04-14 DIAGNOSIS — Z23 Encounter for immunization: Secondary | ICD-10-CM | POA: Diagnosis not present

## 2014-04-14 DIAGNOSIS — D801 Nonfamilial hypogammaglobulinemia: Secondary | ICD-10-CM | POA: Diagnosis not present

## 2014-04-15 DIAGNOSIS — D492 Neoplasm of unspecified behavior of bone, soft tissue, and skin: Secondary | ICD-10-CM | POA: Diagnosis not present

## 2014-04-15 DIAGNOSIS — D485 Neoplasm of uncertain behavior of skin: Secondary | ICD-10-CM | POA: Diagnosis not present

## 2014-04-21 DIAGNOSIS — I495 Sick sinus syndrome: Secondary | ICD-10-CM | POA: Diagnosis not present

## 2014-04-21 DIAGNOSIS — Z950 Presence of cardiac pacemaker: Secondary | ICD-10-CM | POA: Diagnosis not present

## 2014-04-21 DIAGNOSIS — I359 Nonrheumatic aortic valve disorder, unspecified: Secondary | ICD-10-CM | POA: Diagnosis not present

## 2014-04-21 DIAGNOSIS — I482 Chronic atrial fibrillation: Secondary | ICD-10-CM | POA: Diagnosis not present

## 2014-04-21 DIAGNOSIS — Z7901 Long term (current) use of anticoagulants: Secondary | ICD-10-CM | POA: Diagnosis not present

## 2014-04-21 DIAGNOSIS — I2510 Atherosclerotic heart disease of native coronary artery without angina pectoris: Secondary | ICD-10-CM | POA: Diagnosis not present

## 2014-05-11 DIAGNOSIS — J324 Chronic pansinusitis: Secondary | ICD-10-CM | POA: Diagnosis not present

## 2014-05-11 DIAGNOSIS — I495 Sick sinus syndrome: Secondary | ICD-10-CM | POA: Diagnosis not present

## 2014-05-11 DIAGNOSIS — R490 Dysphonia: Secondary | ICD-10-CM | POA: Diagnosis not present

## 2014-05-11 DIAGNOSIS — I359 Nonrheumatic aortic valve disorder, unspecified: Secondary | ICD-10-CM | POA: Diagnosis not present

## 2014-05-11 DIAGNOSIS — Z950 Presence of cardiac pacemaker: Secondary | ICD-10-CM | POA: Diagnosis not present

## 2014-05-11 DIAGNOSIS — J328 Other chronic sinusitis: Secondary | ICD-10-CM | POA: Diagnosis not present

## 2014-05-11 DIAGNOSIS — J339 Nasal polyp, unspecified: Secondary | ICD-10-CM | POA: Diagnosis not present

## 2014-05-11 DIAGNOSIS — I480 Paroxysmal atrial fibrillation: Secondary | ICD-10-CM | POA: Diagnosis not present

## 2014-05-11 DIAGNOSIS — I2510 Atherosclerotic heart disease of native coronary artery without angina pectoris: Secondary | ICD-10-CM | POA: Diagnosis not present

## 2014-05-11 DIAGNOSIS — R0982 Postnasal drip: Secondary | ICD-10-CM | POA: Diagnosis not present

## 2014-05-11 DIAGNOSIS — Z7901 Long term (current) use of anticoagulants: Secondary | ICD-10-CM | POA: Diagnosis not present

## 2014-05-12 DIAGNOSIS — D801 Nonfamilial hypogammaglobulinemia: Secondary | ICD-10-CM | POA: Diagnosis not present

## 2014-05-12 DIAGNOSIS — N5201 Erectile dysfunction due to arterial insufficiency: Secondary | ICD-10-CM | POA: Diagnosis not present

## 2014-05-12 DIAGNOSIS — N400 Enlarged prostate without lower urinary tract symptoms: Secondary | ICD-10-CM | POA: Diagnosis not present

## 2014-06-09 DIAGNOSIS — C4490 Unspecified malignant neoplasm of skin, unspecified: Secondary | ICD-10-CM | POA: Diagnosis not present

## 2014-06-09 DIAGNOSIS — C884 Extranodal marginal zone B-cell lymphoma of mucosa-associated lymphoid tissue [MALT-lymphoma]: Secondary | ICD-10-CM | POA: Diagnosis not present

## 2014-06-09 DIAGNOSIS — D801 Nonfamilial hypogammaglobulinemia: Secondary | ICD-10-CM | POA: Diagnosis not present

## 2014-06-17 ENCOUNTER — Encounter (HOSPITAL_COMMUNITY): Payer: Self-pay | Admitting: Internal Medicine

## 2014-07-08 DIAGNOSIS — H1089 Other conjunctivitis: Secondary | ICD-10-CM | POA: Diagnosis not present

## 2014-07-13 DIAGNOSIS — M5408 Panniculitis affecting regions of neck and back, sacral and sacrococcygeal region: Secondary | ICD-10-CM | POA: Diagnosis not present

## 2014-07-13 DIAGNOSIS — Z8572 Personal history of non-Hodgkin lymphomas: Secondary | ICD-10-CM | POA: Diagnosis not present

## 2014-07-13 DIAGNOSIS — J324 Chronic pansinusitis: Secondary | ICD-10-CM | POA: Diagnosis not present

## 2014-07-13 DIAGNOSIS — J0101 Acute recurrent maxillary sinusitis: Secondary | ICD-10-CM | POA: Diagnosis not present

## 2014-07-13 DIAGNOSIS — I358 Other nonrheumatic aortic valve disorders: Secondary | ICD-10-CM | POA: Diagnosis not present

## 2014-07-13 DIAGNOSIS — J338 Other polyp of sinus: Secondary | ICD-10-CM | POA: Diagnosis not present

## 2014-07-13 DIAGNOSIS — R490 Dysphonia: Secondary | ICD-10-CM | POA: Diagnosis not present

## 2014-07-13 DIAGNOSIS — M532X7 Spinal instabilities, lumbosacral region: Secondary | ICD-10-CM | POA: Diagnosis not present

## 2014-07-13 DIAGNOSIS — I4891 Unspecified atrial fibrillation: Secondary | ICD-10-CM | POA: Diagnosis not present

## 2014-07-13 DIAGNOSIS — M545 Low back pain: Secondary | ICD-10-CM | POA: Diagnosis not present

## 2014-07-13 DIAGNOSIS — R0982 Postnasal drip: Secondary | ICD-10-CM | POA: Diagnosis not present

## 2014-07-13 DIAGNOSIS — Z7901 Long term (current) use of anticoagulants: Secondary | ICD-10-CM | POA: Diagnosis not present

## 2014-07-13 DIAGNOSIS — J019 Acute sinusitis, unspecified: Secondary | ICD-10-CM | POA: Diagnosis not present

## 2014-07-13 DIAGNOSIS — Z950 Presence of cardiac pacemaker: Secondary | ICD-10-CM | POA: Diagnosis not present

## 2014-07-13 DIAGNOSIS — I495 Sick sinus syndrome: Secondary | ICD-10-CM | POA: Diagnosis not present

## 2014-07-14 DIAGNOSIS — D801 Nonfamilial hypogammaglobulinemia: Secondary | ICD-10-CM | POA: Diagnosis not present

## 2014-08-03 DIAGNOSIS — I359 Nonrheumatic aortic valve disorder, unspecified: Secondary | ICD-10-CM | POA: Diagnosis not present

## 2014-08-03 DIAGNOSIS — Z950 Presence of cardiac pacemaker: Secondary | ICD-10-CM | POA: Diagnosis not present

## 2014-08-03 DIAGNOSIS — I2510 Atherosclerotic heart disease of native coronary artery without angina pectoris: Secondary | ICD-10-CM | POA: Diagnosis not present

## 2014-08-03 DIAGNOSIS — Z7901 Long term (current) use of anticoagulants: Secondary | ICD-10-CM | POA: Diagnosis not present

## 2014-08-03 DIAGNOSIS — I495 Sick sinus syndrome: Secondary | ICD-10-CM | POA: Diagnosis not present

## 2014-08-03 DIAGNOSIS — I480 Paroxysmal atrial fibrillation: Secondary | ICD-10-CM | POA: Diagnosis not present

## 2014-08-04 DIAGNOSIS — J029 Acute pharyngitis, unspecified: Secondary | ICD-10-CM | POA: Diagnosis not present

## 2014-08-04 DIAGNOSIS — Z6825 Body mass index (BMI) 25.0-25.9, adult: Secondary | ICD-10-CM | POA: Diagnosis not present

## 2014-08-04 DIAGNOSIS — J019 Acute sinusitis, unspecified: Secondary | ICD-10-CM | POA: Diagnosis not present

## 2014-08-04 DIAGNOSIS — R05 Cough: Secondary | ICD-10-CM | POA: Diagnosis not present

## 2014-08-11 DIAGNOSIS — D801 Nonfamilial hypogammaglobulinemia: Secondary | ICD-10-CM | POA: Diagnosis not present

## 2014-08-17 DIAGNOSIS — H1089 Other conjunctivitis: Secondary | ICD-10-CM | POA: Diagnosis not present

## 2014-08-18 DIAGNOSIS — L570 Actinic keratosis: Secondary | ICD-10-CM | POA: Diagnosis not present

## 2014-08-18 DIAGNOSIS — C44622 Squamous cell carcinoma of skin of right upper limb, including shoulder: Secondary | ICD-10-CM | POA: Diagnosis not present

## 2014-08-25 DIAGNOSIS — J40 Bronchitis, not specified as acute or chronic: Secondary | ICD-10-CM | POA: Diagnosis not present

## 2014-08-25 DIAGNOSIS — J329 Chronic sinusitis, unspecified: Secondary | ICD-10-CM | POA: Diagnosis not present

## 2014-09-14 DIAGNOSIS — D801 Nonfamilial hypogammaglobulinemia: Secondary | ICD-10-CM | POA: Diagnosis not present

## 2014-09-14 DIAGNOSIS — I4891 Unspecified atrial fibrillation: Secondary | ICD-10-CM | POA: Diagnosis not present

## 2014-09-14 DIAGNOSIS — J018 Other acute sinusitis: Secondary | ICD-10-CM | POA: Diagnosis not present

## 2014-09-14 DIAGNOSIS — C884 Extranodal marginal zone B-cell lymphoma of mucosa-associated lymphoid tissue [MALT-lymphoma]: Secondary | ICD-10-CM | POA: Diagnosis not present

## 2014-09-15 DIAGNOSIS — Z79899 Other long term (current) drug therapy: Secondary | ICD-10-CM | POA: Diagnosis not present

## 2014-09-15 DIAGNOSIS — I4891 Unspecified atrial fibrillation: Secondary | ICD-10-CM | POA: Diagnosis not present

## 2014-09-15 DIAGNOSIS — Z85828 Personal history of other malignant neoplasm of skin: Secondary | ICD-10-CM | POA: Diagnosis not present

## 2014-09-15 DIAGNOSIS — H3531 Nonexudative age-related macular degeneration: Secondary | ICD-10-CM | POA: Diagnosis not present

## 2014-09-15 DIAGNOSIS — Z7901 Long term (current) use of anticoagulants: Secondary | ICD-10-CM | POA: Diagnosis not present

## 2014-09-15 DIAGNOSIS — J324 Chronic pansinusitis: Secondary | ICD-10-CM | POA: Diagnosis not present

## 2014-09-15 DIAGNOSIS — R490 Dysphonia: Secondary | ICD-10-CM | POA: Diagnosis not present

## 2014-09-15 DIAGNOSIS — Z961 Presence of intraocular lens: Secondary | ICD-10-CM | POA: Diagnosis not present

## 2014-09-15 DIAGNOSIS — Z950 Presence of cardiac pacemaker: Secondary | ICD-10-CM | POA: Diagnosis not present

## 2014-09-15 DIAGNOSIS — R0982 Postnasal drip: Secondary | ICD-10-CM | POA: Diagnosis not present

## 2014-09-16 DIAGNOSIS — S33140A Subluxation of L4/L5 lumbar vertebra, initial encounter: Secondary | ICD-10-CM | POA: Diagnosis not present

## 2014-09-16 DIAGNOSIS — D229 Melanocytic nevi, unspecified: Secondary | ICD-10-CM | POA: Diagnosis not present

## 2014-09-16 DIAGNOSIS — M797 Fibromyalgia: Secondary | ICD-10-CM | POA: Diagnosis not present

## 2014-09-16 DIAGNOSIS — Z85828 Personal history of other malignant neoplasm of skin: Secondary | ICD-10-CM | POA: Diagnosis not present

## 2014-09-16 DIAGNOSIS — M545 Low back pain: Secondary | ICD-10-CM | POA: Diagnosis not present

## 2014-09-16 DIAGNOSIS — L821 Other seborrheic keratosis: Secondary | ICD-10-CM | POA: Diagnosis not present

## 2014-10-11 DIAGNOSIS — J018 Other acute sinusitis: Secondary | ICD-10-CM | POA: Diagnosis not present

## 2014-10-11 DIAGNOSIS — C884 Extranodal marginal zone B-cell lymphoma of mucosa-associated lymphoid tissue [MALT-lymphoma]: Secondary | ICD-10-CM | POA: Diagnosis not present

## 2014-10-12 DIAGNOSIS — Z08 Encounter for follow-up examination after completed treatment for malignant neoplasm: Secondary | ICD-10-CM | POA: Diagnosis not present

## 2014-10-12 DIAGNOSIS — Z79899 Other long term (current) drug therapy: Secondary | ICD-10-CM | POA: Diagnosis not present

## 2014-10-12 DIAGNOSIS — C884 Extranodal marginal zone B-cell lymphoma of mucosa-associated lymphoid tissue [MALT-lymphoma]: Secondary | ICD-10-CM | POA: Diagnosis not present

## 2014-10-12 DIAGNOSIS — Z8572 Personal history of non-Hodgkin lymphomas: Secondary | ICD-10-CM | POA: Diagnosis not present

## 2014-11-18 DIAGNOSIS — H10023 Other mucopurulent conjunctivitis, bilateral: Secondary | ICD-10-CM | POA: Diagnosis not present

## 2014-11-18 DIAGNOSIS — J189 Pneumonia, unspecified organism: Secondary | ICD-10-CM | POA: Diagnosis not present

## 2014-11-29 DIAGNOSIS — I480 Paroxysmal atrial fibrillation: Secondary | ICD-10-CM | POA: Diagnosis not present

## 2014-11-29 DIAGNOSIS — Z7901 Long term (current) use of anticoagulants: Secondary | ICD-10-CM | POA: Diagnosis not present

## 2014-11-29 DIAGNOSIS — I359 Nonrheumatic aortic valve disorder, unspecified: Secondary | ICD-10-CM | POA: Diagnosis not present

## 2014-11-29 DIAGNOSIS — I495 Sick sinus syndrome: Secondary | ICD-10-CM | POA: Diagnosis not present

## 2014-11-29 DIAGNOSIS — Z950 Presence of cardiac pacemaker: Secondary | ICD-10-CM | POA: Diagnosis not present

## 2014-11-29 DIAGNOSIS — I2510 Atherosclerotic heart disease of native coronary artery without angina pectoris: Secondary | ICD-10-CM | POA: Diagnosis not present

## 2014-12-01 DIAGNOSIS — M545 Low back pain: Secondary | ICD-10-CM | POA: Diagnosis not present

## 2014-12-01 DIAGNOSIS — M9904 Segmental and somatic dysfunction of sacral region: Secondary | ICD-10-CM | POA: Diagnosis not present

## 2014-12-01 DIAGNOSIS — M797 Fibromyalgia: Secondary | ICD-10-CM | POA: Diagnosis not present

## 2014-12-03 DIAGNOSIS — E785 Hyperlipidemia, unspecified: Secondary | ICD-10-CM | POA: Diagnosis not present

## 2014-12-03 DIAGNOSIS — I480 Paroxysmal atrial fibrillation: Secondary | ICD-10-CM | POA: Diagnosis not present

## 2014-12-03 DIAGNOSIS — Z79899 Other long term (current) drug therapy: Secondary | ICD-10-CM | POA: Diagnosis not present

## 2014-12-03 DIAGNOSIS — R7301 Impaired fasting glucose: Secondary | ICD-10-CM | POA: Diagnosis not present

## 2014-12-03 DIAGNOSIS — Z1212 Encounter for screening for malignant neoplasm of rectum: Secondary | ICD-10-CM | POA: Diagnosis not present

## 2014-12-03 DIAGNOSIS — Z125 Encounter for screening for malignant neoplasm of prostate: Secondary | ICD-10-CM | POA: Diagnosis not present

## 2014-12-08 DIAGNOSIS — K219 Gastro-esophageal reflux disease without esophagitis: Secondary | ICD-10-CM | POA: Diagnosis not present

## 2014-12-08 DIAGNOSIS — J324 Chronic pansinusitis: Secondary | ICD-10-CM | POA: Diagnosis not present

## 2014-12-08 DIAGNOSIS — R490 Dysphonia: Secondary | ICD-10-CM | POA: Diagnosis not present

## 2014-12-08 DIAGNOSIS — R0982 Postnasal drip: Secondary | ICD-10-CM | POA: Diagnosis not present

## 2014-12-08 DIAGNOSIS — J339 Nasal polyp, unspecified: Secondary | ICD-10-CM | POA: Diagnosis not present

## 2014-12-09 DIAGNOSIS — I2510 Atherosclerotic heart disease of native coronary artery without angina pectoris: Secondary | ICD-10-CM | POA: Diagnosis not present

## 2014-12-09 DIAGNOSIS — I359 Nonrheumatic aortic valve disorder, unspecified: Secondary | ICD-10-CM | POA: Diagnosis not present

## 2014-12-09 DIAGNOSIS — Z950 Presence of cardiac pacemaker: Secondary | ICD-10-CM | POA: Diagnosis not present

## 2014-12-09 DIAGNOSIS — I495 Sick sinus syndrome: Secondary | ICD-10-CM | POA: Diagnosis not present

## 2014-12-09 DIAGNOSIS — I480 Paroxysmal atrial fibrillation: Secondary | ICD-10-CM | POA: Diagnosis not present

## 2014-12-09 DIAGNOSIS — Z7901 Long term (current) use of anticoagulants: Secondary | ICD-10-CM | POA: Diagnosis not present

## 2014-12-10 DIAGNOSIS — Z0000 Encounter for general adult medical examination without abnormal findings: Secondary | ICD-10-CM | POA: Diagnosis not present

## 2014-12-10 DIAGNOSIS — I480 Paroxysmal atrial fibrillation: Secondary | ICD-10-CM | POA: Diagnosis not present

## 2014-12-10 DIAGNOSIS — Z6825 Body mass index (BMI) 25.0-25.9, adult: Secondary | ICD-10-CM | POA: Diagnosis not present

## 2014-12-10 DIAGNOSIS — N401 Enlarged prostate with lower urinary tract symptoms: Secondary | ICD-10-CM | POA: Diagnosis not present

## 2014-12-10 DIAGNOSIS — E785 Hyperlipidemia, unspecified: Secondary | ICD-10-CM | POA: Diagnosis not present

## 2014-12-10 DIAGNOSIS — E7439 Other disorders of intestinal carbohydrate absorption: Secondary | ICD-10-CM | POA: Diagnosis not present

## 2014-12-10 DIAGNOSIS — G629 Polyneuropathy, unspecified: Secondary | ICD-10-CM | POA: Diagnosis not present

## 2014-12-10 DIAGNOSIS — C8590 Non-Hodgkin lymphoma, unspecified, unspecified site: Secondary | ICD-10-CM | POA: Diagnosis not present

## 2014-12-10 DIAGNOSIS — Z1389 Encounter for screening for other disorder: Secondary | ICD-10-CM | POA: Diagnosis not present

## 2015-01-12 DIAGNOSIS — D801 Nonfamilial hypogammaglobulinemia: Secondary | ICD-10-CM | POA: Diagnosis not present

## 2015-01-12 DIAGNOSIS — I4891 Unspecified atrial fibrillation: Secondary | ICD-10-CM | POA: Diagnosis not present

## 2015-01-12 DIAGNOSIS — Z950 Presence of cardiac pacemaker: Secondary | ICD-10-CM | POA: Diagnosis not present

## 2015-01-12 DIAGNOSIS — Z7901 Long term (current) use of anticoagulants: Secondary | ICD-10-CM | POA: Diagnosis not present

## 2015-01-12 DIAGNOSIS — N400 Enlarged prostate without lower urinary tract symptoms: Secondary | ICD-10-CM | POA: Diagnosis not present

## 2015-01-12 DIAGNOSIS — M9904 Segmental and somatic dysfunction of sacral region: Secondary | ICD-10-CM | POA: Diagnosis not present

## 2015-01-12 DIAGNOSIS — K219 Gastro-esophageal reflux disease without esophagitis: Secondary | ICD-10-CM | POA: Diagnosis not present

## 2015-01-12 DIAGNOSIS — G541 Lumbosacral plexus disorders: Secondary | ICD-10-CM | POA: Diagnosis not present

## 2015-01-12 DIAGNOSIS — Z01810 Encounter for preprocedural cardiovascular examination: Secondary | ICD-10-CM | POA: Diagnosis not present

## 2015-01-12 DIAGNOSIS — Z9221 Personal history of antineoplastic chemotherapy: Secondary | ICD-10-CM | POA: Diagnosis not present

## 2015-01-12 DIAGNOSIS — J3801 Paralysis of vocal cords and larynx, unilateral: Secondary | ICD-10-CM | POA: Diagnosis not present

## 2015-01-12 DIAGNOSIS — M5416 Radiculopathy, lumbar region: Secondary | ICD-10-CM | POA: Diagnosis not present

## 2015-01-12 DIAGNOSIS — Z8572 Personal history of non-Hodgkin lymphomas: Secondary | ICD-10-CM | POA: Diagnosis not present

## 2015-01-12 DIAGNOSIS — I495 Sick sinus syndrome: Secondary | ICD-10-CM | POA: Diagnosis not present

## 2015-01-12 DIAGNOSIS — Z79899 Other long term (current) drug therapy: Secondary | ICD-10-CM | POA: Diagnosis not present

## 2015-01-12 DIAGNOSIS — G4700 Insomnia, unspecified: Secondary | ICD-10-CM | POA: Diagnosis not present

## 2015-01-12 DIAGNOSIS — M545 Low back pain: Secondary | ICD-10-CM | POA: Diagnosis not present

## 2015-01-12 DIAGNOSIS — R490 Dysphonia: Secondary | ICD-10-CM | POA: Diagnosis not present

## 2015-01-12 DIAGNOSIS — J339 Nasal polyp, unspecified: Secondary | ICD-10-CM | POA: Diagnosis not present

## 2015-01-12 DIAGNOSIS — Z85828 Personal history of other malignant neoplasm of skin: Secondary | ICD-10-CM | POA: Diagnosis not present

## 2015-01-13 DIAGNOSIS — I495 Sick sinus syndrome: Secondary | ICD-10-CM | POA: Diagnosis not present

## 2015-01-13 DIAGNOSIS — J383 Other diseases of vocal cords: Secondary | ICD-10-CM | POA: Diagnosis not present

## 2015-01-13 DIAGNOSIS — J3801 Paralysis of vocal cords and larynx, unilateral: Secondary | ICD-10-CM | POA: Diagnosis not present

## 2015-01-13 DIAGNOSIS — I4891 Unspecified atrial fibrillation: Secondary | ICD-10-CM | POA: Diagnosis not present

## 2015-01-13 DIAGNOSIS — Z950 Presence of cardiac pacemaker: Secondary | ICD-10-CM | POA: Diagnosis not present

## 2015-01-13 DIAGNOSIS — R490 Dysphonia: Secondary | ICD-10-CM | POA: Diagnosis not present

## 2015-01-13 DIAGNOSIS — Z8572 Personal history of non-Hodgkin lymphomas: Secondary | ICD-10-CM | POA: Diagnosis not present

## 2015-01-18 DIAGNOSIS — I4891 Unspecified atrial fibrillation: Secondary | ICD-10-CM | POA: Diagnosis not present

## 2015-01-18 DIAGNOSIS — D801 Nonfamilial hypogammaglobulinemia: Secondary | ICD-10-CM | POA: Diagnosis not present

## 2015-01-18 DIAGNOSIS — C884 Extranodal marginal zone B-cell lymphoma of mucosa-associated lymphoid tissue [MALT-lymphoma]: Secondary | ICD-10-CM | POA: Diagnosis not present

## 2015-02-09 DIAGNOSIS — R490 Dysphonia: Secondary | ICD-10-CM | POA: Diagnosis not present

## 2015-02-09 DIAGNOSIS — Z9889 Other specified postprocedural states: Secondary | ICD-10-CM | POA: Diagnosis not present

## 2015-02-09 DIAGNOSIS — J383 Other diseases of vocal cords: Secondary | ICD-10-CM | POA: Diagnosis not present

## 2015-02-09 DIAGNOSIS — Z4889 Encounter for other specified surgical aftercare: Secondary | ICD-10-CM | POA: Diagnosis not present

## 2015-02-10 DIAGNOSIS — M9903 Segmental and somatic dysfunction of lumbar region: Secondary | ICD-10-CM | POA: Diagnosis not present

## 2015-02-10 DIAGNOSIS — M9904 Segmental and somatic dysfunction of sacral region: Secondary | ICD-10-CM | POA: Diagnosis not present

## 2015-02-10 DIAGNOSIS — M797 Fibromyalgia: Secondary | ICD-10-CM | POA: Diagnosis not present

## 2015-03-03 DIAGNOSIS — Z7901 Long term (current) use of anticoagulants: Secondary | ICD-10-CM | POA: Diagnosis not present

## 2015-03-03 DIAGNOSIS — I480 Paroxysmal atrial fibrillation: Secondary | ICD-10-CM | POA: Diagnosis not present

## 2015-03-03 DIAGNOSIS — Z950 Presence of cardiac pacemaker: Secondary | ICD-10-CM | POA: Diagnosis not present

## 2015-03-03 DIAGNOSIS — I359 Nonrheumatic aortic valve disorder, unspecified: Secondary | ICD-10-CM | POA: Diagnosis not present

## 2015-03-03 DIAGNOSIS — I2510 Atherosclerotic heart disease of native coronary artery without angina pectoris: Secondary | ICD-10-CM | POA: Diagnosis not present

## 2015-03-03 DIAGNOSIS — I495 Sick sinus syndrome: Secondary | ICD-10-CM | POA: Diagnosis not present

## 2015-03-04 DIAGNOSIS — Z6824 Body mass index (BMI) 24.0-24.9, adult: Secondary | ICD-10-CM | POA: Diagnosis not present

## 2015-03-04 DIAGNOSIS — R05 Cough: Secondary | ICD-10-CM | POA: Diagnosis not present

## 2015-03-04 DIAGNOSIS — J189 Pneumonia, unspecified organism: Secondary | ICD-10-CM | POA: Diagnosis not present

## 2015-03-04 DIAGNOSIS — C8590 Non-Hodgkin lymphoma, unspecified, unspecified site: Secondary | ICD-10-CM | POA: Diagnosis not present

## 2015-03-15 DIAGNOSIS — Z09 Encounter for follow-up examination after completed treatment for conditions other than malignant neoplasm: Secondary | ICD-10-CM | POA: Diagnosis not present

## 2015-03-15 DIAGNOSIS — J383 Other diseases of vocal cords: Secondary | ICD-10-CM | POA: Diagnosis not present

## 2015-03-15 DIAGNOSIS — R490 Dysphonia: Secondary | ICD-10-CM | POA: Diagnosis not present

## 2015-03-15 DIAGNOSIS — J384 Edema of larynx: Secondary | ICD-10-CM | POA: Diagnosis not present

## 2015-03-17 DIAGNOSIS — Z23 Encounter for immunization: Secondary | ICD-10-CM | POA: Diagnosis not present

## 2015-03-17 DIAGNOSIS — D801 Nonfamilial hypogammaglobulinemia: Secondary | ICD-10-CM | POA: Diagnosis not present

## 2015-03-17 DIAGNOSIS — J17 Pneumonia in diseases classified elsewhere: Secondary | ICD-10-CM | POA: Diagnosis not present

## 2015-03-17 DIAGNOSIS — D72819 Decreased white blood cell count, unspecified: Secondary | ICD-10-CM | POA: Diagnosis not present

## 2015-03-17 DIAGNOSIS — B999 Unspecified infectious disease: Secondary | ICD-10-CM | POA: Diagnosis not present

## 2015-03-17 DIAGNOSIS — C884 Extranodal marginal zone B-cell lymphoma of mucosa-associated lymphoid tissue [MALT-lymphoma]: Secondary | ICD-10-CM | POA: Diagnosis not present

## 2015-03-17 DIAGNOSIS — R490 Dysphonia: Secondary | ICD-10-CM | POA: Diagnosis not present

## 2015-03-22 DIAGNOSIS — J324 Chronic pansinusitis: Secondary | ICD-10-CM | POA: Diagnosis not present

## 2015-03-22 DIAGNOSIS — R0982 Postnasal drip: Secondary | ICD-10-CM | POA: Diagnosis not present

## 2015-03-22 DIAGNOSIS — J339 Nasal polyp, unspecified: Secondary | ICD-10-CM | POA: Diagnosis not present

## 2015-03-23 DIAGNOSIS — L570 Actinic keratosis: Secondary | ICD-10-CM | POA: Diagnosis not present

## 2015-03-23 DIAGNOSIS — D1801 Hemangioma of skin and subcutaneous tissue: Secondary | ICD-10-CM | POA: Diagnosis not present

## 2015-03-23 DIAGNOSIS — C44519 Basal cell carcinoma of skin of other part of trunk: Secondary | ICD-10-CM | POA: Diagnosis not present

## 2015-03-23 DIAGNOSIS — L812 Freckles: Secondary | ICD-10-CM | POA: Diagnosis not present

## 2015-03-23 DIAGNOSIS — L821 Other seborrheic keratosis: Secondary | ICD-10-CM | POA: Diagnosis not present

## 2015-03-23 DIAGNOSIS — D485 Neoplasm of uncertain behavior of skin: Secondary | ICD-10-CM | POA: Diagnosis not present

## 2015-03-23 DIAGNOSIS — Z85828 Personal history of other malignant neoplasm of skin: Secondary | ICD-10-CM | POA: Diagnosis not present

## 2015-04-11 DIAGNOSIS — J383 Other diseases of vocal cords: Secondary | ICD-10-CM | POA: Diagnosis not present

## 2015-04-11 DIAGNOSIS — R490 Dysphonia: Secondary | ICD-10-CM | POA: Diagnosis not present

## 2015-04-11 DIAGNOSIS — R498 Other voice and resonance disorders: Secondary | ICD-10-CM | POA: Diagnosis not present

## 2015-04-11 DIAGNOSIS — J3800 Paralysis of vocal cords and larynx, unspecified: Secondary | ICD-10-CM | POA: Diagnosis not present

## 2015-04-12 DIAGNOSIS — M545 Low back pain: Secondary | ICD-10-CM | POA: Diagnosis not present

## 2015-04-12 DIAGNOSIS — M9904 Segmental and somatic dysfunction of sacral region: Secondary | ICD-10-CM | POA: Diagnosis not present

## 2015-04-12 DIAGNOSIS — G541 Lumbosacral plexus disorders: Secondary | ICD-10-CM | POA: Diagnosis not present

## 2015-04-12 DIAGNOSIS — M5416 Radiculopathy, lumbar region: Secondary | ICD-10-CM | POA: Diagnosis not present

## 2015-04-13 DIAGNOSIS — C44519 Basal cell carcinoma of skin of other part of trunk: Secondary | ICD-10-CM | POA: Diagnosis not present

## 2015-04-14 DIAGNOSIS — D801 Nonfamilial hypogammaglobulinemia: Secondary | ICD-10-CM | POA: Diagnosis not present

## 2015-05-18 DIAGNOSIS — M9903 Segmental and somatic dysfunction of lumbar region: Secondary | ICD-10-CM | POA: Diagnosis not present

## 2015-05-18 DIAGNOSIS — Z9889 Other specified postprocedural states: Secondary | ICD-10-CM | POA: Diagnosis not present

## 2015-05-18 DIAGNOSIS — R490 Dysphonia: Secondary | ICD-10-CM | POA: Diagnosis not present

## 2015-05-18 DIAGNOSIS — M797 Fibromyalgia: Secondary | ICD-10-CM | POA: Diagnosis not present

## 2015-05-18 DIAGNOSIS — Z862 Personal history of diseases of the blood and blood-forming organs and certain disorders involving the immune mechanism: Secondary | ICD-10-CM | POA: Diagnosis not present

## 2015-05-18 DIAGNOSIS — J383 Other diseases of vocal cords: Secondary | ICD-10-CM | POA: Diagnosis not present

## 2015-05-18 DIAGNOSIS — M9904 Segmental and somatic dysfunction of sacral region: Secondary | ICD-10-CM | POA: Diagnosis not present

## 2015-05-18 DIAGNOSIS — M545 Low back pain: Secondary | ICD-10-CM | POA: Diagnosis not present

## 2015-05-19 DIAGNOSIS — Z23 Encounter for immunization: Secondary | ICD-10-CM | POA: Diagnosis not present

## 2015-05-19 DIAGNOSIS — D801 Nonfamilial hypogammaglobulinemia: Secondary | ICD-10-CM | POA: Diagnosis not present

## 2015-06-07 DIAGNOSIS — Z7901 Long term (current) use of anticoagulants: Secondary | ICD-10-CM | POA: Diagnosis not present

## 2015-06-07 DIAGNOSIS — Z950 Presence of cardiac pacemaker: Secondary | ICD-10-CM | POA: Diagnosis not present

## 2015-06-07 DIAGNOSIS — I495 Sick sinus syndrome: Secondary | ICD-10-CM | POA: Diagnosis not present

## 2015-06-07 DIAGNOSIS — I480 Paroxysmal atrial fibrillation: Secondary | ICD-10-CM | POA: Diagnosis not present

## 2015-06-07 DIAGNOSIS — I2510 Atherosclerotic heart disease of native coronary artery without angina pectoris: Secondary | ICD-10-CM | POA: Diagnosis not present

## 2015-06-07 DIAGNOSIS — I359 Nonrheumatic aortic valve disorder, unspecified: Secondary | ICD-10-CM | POA: Diagnosis not present

## 2015-06-22 DIAGNOSIS — Z125 Encounter for screening for malignant neoplasm of prostate: Secondary | ICD-10-CM | POA: Diagnosis not present

## 2015-06-22 DIAGNOSIS — N400 Enlarged prostate without lower urinary tract symptoms: Secondary | ICD-10-CM | POA: Diagnosis not present

## 2015-06-22 DIAGNOSIS — N5201 Erectile dysfunction due to arterial insufficiency: Secondary | ICD-10-CM | POA: Diagnosis not present

## 2015-06-23 DIAGNOSIS — R05 Cough: Secondary | ICD-10-CM | POA: Diagnosis not present

## 2015-06-23 DIAGNOSIS — R918 Other nonspecific abnormal finding of lung field: Secondary | ICD-10-CM | POA: Diagnosis not present

## 2015-06-23 DIAGNOSIS — D801 Nonfamilial hypogammaglobulinemia: Secondary | ICD-10-CM | POA: Diagnosis not present

## 2015-06-24 DIAGNOSIS — J383 Other diseases of vocal cords: Secondary | ICD-10-CM | POA: Diagnosis not present

## 2015-06-24 DIAGNOSIS — R490 Dysphonia: Secondary | ICD-10-CM | POA: Diagnosis not present

## 2015-06-28 DIAGNOSIS — Z7901 Long term (current) use of anticoagulants: Secondary | ICD-10-CM | POA: Diagnosis not present

## 2015-06-28 DIAGNOSIS — I495 Sick sinus syndrome: Secondary | ICD-10-CM | POA: Diagnosis not present

## 2015-06-28 DIAGNOSIS — Z950 Presence of cardiac pacemaker: Secondary | ICD-10-CM | POA: Diagnosis not present

## 2015-06-28 DIAGNOSIS — I359 Nonrheumatic aortic valve disorder, unspecified: Secondary | ICD-10-CM | POA: Diagnosis not present

## 2015-06-28 DIAGNOSIS — I2510 Atherosclerotic heart disease of native coronary artery without angina pectoris: Secondary | ICD-10-CM | POA: Diagnosis not present

## 2015-06-28 DIAGNOSIS — I480 Paroxysmal atrial fibrillation: Secondary | ICD-10-CM | POA: Diagnosis not present

## 2015-07-20 DIAGNOSIS — M9904 Segmental and somatic dysfunction of sacral region: Secondary | ICD-10-CM | POA: Diagnosis not present

## 2015-07-20 DIAGNOSIS — M9903 Segmental and somatic dysfunction of lumbar region: Secondary | ICD-10-CM | POA: Diagnosis not present

## 2015-07-20 DIAGNOSIS — M545 Low back pain: Secondary | ICD-10-CM | POA: Diagnosis not present

## 2015-07-20 DIAGNOSIS — M797 Fibromyalgia: Secondary | ICD-10-CM | POA: Diagnosis not present

## 2015-07-21 DIAGNOSIS — J324 Chronic pansinusitis: Secondary | ICD-10-CM | POA: Diagnosis not present

## 2015-07-21 DIAGNOSIS — J339 Nasal polyp, unspecified: Secondary | ICD-10-CM | POA: Diagnosis not present

## 2015-07-21 DIAGNOSIS — I4891 Unspecified atrial fibrillation: Secondary | ICD-10-CM | POA: Diagnosis not present

## 2015-07-21 DIAGNOSIS — R490 Dysphonia: Secondary | ICD-10-CM | POA: Diagnosis not present

## 2015-07-21 DIAGNOSIS — R0982 Postnasal drip: Secondary | ICD-10-CM | POA: Diagnosis not present

## 2015-07-21 DIAGNOSIS — H9193 Unspecified hearing loss, bilateral: Secondary | ICD-10-CM | POA: Diagnosis not present

## 2015-07-21 DIAGNOSIS — D801 Nonfamilial hypogammaglobulinemia: Secondary | ICD-10-CM | POA: Diagnosis not present

## 2015-07-22 DIAGNOSIS — J324 Chronic pansinusitis: Secondary | ICD-10-CM | POA: Diagnosis not present

## 2015-07-22 DIAGNOSIS — D801 Nonfamilial hypogammaglobulinemia: Secondary | ICD-10-CM | POA: Diagnosis not present

## 2015-07-22 DIAGNOSIS — R0982 Postnasal drip: Secondary | ICD-10-CM | POA: Diagnosis not present

## 2015-07-22 DIAGNOSIS — C884 Extranodal marginal zone B-cell lymphoma of mucosa-associated lymphoid tissue [MALT-lymphoma]: Secondary | ICD-10-CM | POA: Diagnosis not present

## 2015-08-16 DIAGNOSIS — M797 Fibromyalgia: Secondary | ICD-10-CM | POA: Diagnosis not present

## 2015-08-16 DIAGNOSIS — M545 Low back pain: Secondary | ICD-10-CM | POA: Diagnosis not present

## 2015-08-16 DIAGNOSIS — M9903 Segmental and somatic dysfunction of lumbar region: Secondary | ICD-10-CM | POA: Diagnosis not present

## 2015-08-16 DIAGNOSIS — M9904 Segmental and somatic dysfunction of sacral region: Secondary | ICD-10-CM | POA: Diagnosis not present

## 2015-08-17 DIAGNOSIS — J387 Other diseases of larynx: Secondary | ICD-10-CM | POA: Diagnosis not present

## 2015-08-17 DIAGNOSIS — D801 Nonfamilial hypogammaglobulinemia: Secondary | ICD-10-CM | POA: Diagnosis not present

## 2015-08-17 DIAGNOSIS — J324 Chronic pansinusitis: Secondary | ICD-10-CM | POA: Diagnosis not present

## 2015-08-17 DIAGNOSIS — R490 Dysphonia: Secondary | ICD-10-CM | POA: Diagnosis not present

## 2015-08-17 DIAGNOSIS — J383 Other diseases of vocal cords: Secondary | ICD-10-CM | POA: Diagnosis not present

## 2015-08-19 DIAGNOSIS — J320 Chronic maxillary sinusitis: Secondary | ICD-10-CM | POA: Diagnosis not present

## 2015-08-19 DIAGNOSIS — D801 Nonfamilial hypogammaglobulinemia: Secondary | ICD-10-CM | POA: Diagnosis not present

## 2015-08-19 DIAGNOSIS — Z8572 Personal history of non-Hodgkin lymphomas: Secondary | ICD-10-CM | POA: Diagnosis not present

## 2015-09-07 DIAGNOSIS — I480 Paroxysmal atrial fibrillation: Secondary | ICD-10-CM | POA: Diagnosis not present

## 2015-09-07 DIAGNOSIS — I2510 Atherosclerotic heart disease of native coronary artery without angina pectoris: Secondary | ICD-10-CM | POA: Diagnosis not present

## 2015-09-07 DIAGNOSIS — Z7901 Long term (current) use of anticoagulants: Secondary | ICD-10-CM | POA: Diagnosis not present

## 2015-09-07 DIAGNOSIS — I495 Sick sinus syndrome: Secondary | ICD-10-CM | POA: Diagnosis not present

## 2015-09-07 DIAGNOSIS — Z950 Presence of cardiac pacemaker: Secondary | ICD-10-CM | POA: Diagnosis not present

## 2015-09-07 DIAGNOSIS — I359 Nonrheumatic aortic valve disorder, unspecified: Secondary | ICD-10-CM | POA: Diagnosis not present

## 2015-09-20 DIAGNOSIS — D801 Nonfamilial hypogammaglobulinemia: Secondary | ICD-10-CM | POA: Diagnosis not present

## 2015-09-21 DIAGNOSIS — L821 Other seborrheic keratosis: Secondary | ICD-10-CM | POA: Diagnosis not present

## 2015-09-21 DIAGNOSIS — L812 Freckles: Secondary | ICD-10-CM | POA: Diagnosis not present

## 2015-09-21 DIAGNOSIS — D0462 Carcinoma in situ of skin of left upper limb, including shoulder: Secondary | ICD-10-CM | POA: Diagnosis not present

## 2015-09-21 DIAGNOSIS — D485 Neoplasm of uncertain behavior of skin: Secondary | ICD-10-CM | POA: Diagnosis not present

## 2015-09-21 DIAGNOSIS — J383 Other diseases of vocal cords: Secondary | ICD-10-CM | POA: Diagnosis not present

## 2015-09-21 DIAGNOSIS — R490 Dysphonia: Secondary | ICD-10-CM | POA: Diagnosis not present

## 2015-09-21 DIAGNOSIS — L570 Actinic keratosis: Secondary | ICD-10-CM | POA: Diagnosis not present

## 2015-09-21 DIAGNOSIS — D1801 Hemangioma of skin and subcutaneous tissue: Secondary | ICD-10-CM | POA: Diagnosis not present

## 2015-09-21 DIAGNOSIS — Z85828 Personal history of other malignant neoplasm of skin: Secondary | ICD-10-CM | POA: Diagnosis not present

## 2015-10-12 DIAGNOSIS — M9903 Segmental and somatic dysfunction of lumbar region: Secondary | ICD-10-CM | POA: Diagnosis not present

## 2015-10-12 DIAGNOSIS — M9904 Segmental and somatic dysfunction of sacral region: Secondary | ICD-10-CM | POA: Diagnosis not present

## 2015-10-12 DIAGNOSIS — M545 Low back pain: Secondary | ICD-10-CM | POA: Diagnosis not present

## 2015-10-12 DIAGNOSIS — M797 Fibromyalgia: Secondary | ICD-10-CM | POA: Diagnosis not present

## 2015-10-19 DIAGNOSIS — R490 Dysphonia: Secondary | ICD-10-CM | POA: Diagnosis not present

## 2015-10-19 DIAGNOSIS — J383 Other diseases of vocal cords: Secondary | ICD-10-CM | POA: Diagnosis not present

## 2015-10-20 DIAGNOSIS — C884 Extranodal marginal zone B-cell lymphoma of mucosa-associated lymphoid tissue [MALT-lymphoma]: Secondary | ICD-10-CM | POA: Diagnosis not present

## 2015-10-20 DIAGNOSIS — D801 Nonfamilial hypogammaglobulinemia: Secondary | ICD-10-CM | POA: Diagnosis not present

## 2015-10-26 DIAGNOSIS — R490 Dysphonia: Secondary | ICD-10-CM | POA: Diagnosis not present

## 2015-10-26 DIAGNOSIS — J383 Other diseases of vocal cords: Secondary | ICD-10-CM | POA: Diagnosis not present

## 2015-11-03 DIAGNOSIS — D045 Carcinoma in situ of skin of trunk: Secondary | ICD-10-CM | POA: Diagnosis not present

## 2015-11-28 DIAGNOSIS — M9904 Segmental and somatic dysfunction of sacral region: Secondary | ICD-10-CM | POA: Diagnosis not present

## 2015-11-28 DIAGNOSIS — M797 Fibromyalgia: Secondary | ICD-10-CM | POA: Diagnosis not present

## 2015-11-28 DIAGNOSIS — M9903 Segmental and somatic dysfunction of lumbar region: Secondary | ICD-10-CM | POA: Diagnosis not present

## 2015-11-28 DIAGNOSIS — M545 Low back pain: Secondary | ICD-10-CM | POA: Diagnosis not present

## 2015-11-29 DIAGNOSIS — H25813 Combined forms of age-related cataract, bilateral: Secondary | ICD-10-CM | POA: Diagnosis not present

## 2015-11-29 DIAGNOSIS — D801 Nonfamilial hypogammaglobulinemia: Secondary | ICD-10-CM | POA: Diagnosis not present

## 2015-11-29 DIAGNOSIS — H52223 Regular astigmatism, bilateral: Secondary | ICD-10-CM | POA: Diagnosis not present

## 2015-11-29 DIAGNOSIS — H5203 Hypermetropia, bilateral: Secondary | ICD-10-CM | POA: Diagnosis not present

## 2015-11-30 DIAGNOSIS — J320 Chronic maxillary sinusitis: Secondary | ICD-10-CM | POA: Diagnosis not present

## 2015-11-30 DIAGNOSIS — J3489 Other specified disorders of nose and nasal sinuses: Secondary | ICD-10-CM | POA: Diagnosis not present

## 2015-11-30 DIAGNOSIS — J339 Nasal polyp, unspecified: Secondary | ICD-10-CM | POA: Diagnosis not present

## 2015-11-30 DIAGNOSIS — J324 Chronic pansinusitis: Secondary | ICD-10-CM | POA: Diagnosis not present

## 2015-11-30 DIAGNOSIS — R490 Dysphonia: Secondary | ICD-10-CM | POA: Diagnosis not present

## 2015-11-30 DIAGNOSIS — H9193 Unspecified hearing loss, bilateral: Secondary | ICD-10-CM | POA: Diagnosis not present

## 2015-12-20 DIAGNOSIS — Z7901 Long term (current) use of anticoagulants: Secondary | ICD-10-CM | POA: Diagnosis not present

## 2015-12-20 DIAGNOSIS — Z125 Encounter for screening for malignant neoplasm of prostate: Secondary | ICD-10-CM | POA: Diagnosis not present

## 2015-12-20 DIAGNOSIS — R7309 Other abnormal glucose: Secondary | ICD-10-CM | POA: Diagnosis not present

## 2015-12-20 DIAGNOSIS — I495 Sick sinus syndrome: Secondary | ICD-10-CM | POA: Diagnosis not present

## 2015-12-20 DIAGNOSIS — I480 Paroxysmal atrial fibrillation: Secondary | ICD-10-CM | POA: Diagnosis not present

## 2015-12-20 DIAGNOSIS — I359 Nonrheumatic aortic valve disorder, unspecified: Secondary | ICD-10-CM | POA: Diagnosis not present

## 2015-12-20 DIAGNOSIS — E784 Other hyperlipidemia: Secondary | ICD-10-CM | POA: Diagnosis not present

## 2015-12-20 DIAGNOSIS — Z950 Presence of cardiac pacemaker: Secondary | ICD-10-CM | POA: Diagnosis not present

## 2015-12-20 DIAGNOSIS — I2510 Atherosclerotic heart disease of native coronary artery without angina pectoris: Secondary | ICD-10-CM | POA: Diagnosis not present

## 2015-12-23 DIAGNOSIS — I495 Sick sinus syndrome: Secondary | ICD-10-CM | POA: Diagnosis not present

## 2015-12-23 DIAGNOSIS — Z950 Presence of cardiac pacemaker: Secondary | ICD-10-CM | POA: Diagnosis not present

## 2015-12-23 DIAGNOSIS — Z7901 Long term (current) use of anticoagulants: Secondary | ICD-10-CM | POA: Diagnosis not present

## 2015-12-23 DIAGNOSIS — I2510 Atherosclerotic heart disease of native coronary artery without angina pectoris: Secondary | ICD-10-CM | POA: Diagnosis not present

## 2015-12-23 DIAGNOSIS — I480 Paroxysmal atrial fibrillation: Secondary | ICD-10-CM | POA: Diagnosis not present

## 2015-12-23 DIAGNOSIS — I359 Nonrheumatic aortic valve disorder, unspecified: Secondary | ICD-10-CM | POA: Diagnosis not present

## 2015-12-30 DIAGNOSIS — Z1212 Encounter for screening for malignant neoplasm of rectum: Secondary | ICD-10-CM | POA: Diagnosis not present

## 2016-01-03 DIAGNOSIS — D801 Nonfamilial hypogammaglobulinemia: Secondary | ICD-10-CM | POA: Diagnosis not present

## 2016-01-04 DIAGNOSIS — E784 Other hyperlipidemia: Secondary | ICD-10-CM | POA: Diagnosis not present

## 2016-01-04 DIAGNOSIS — Z0000 Encounter for general adult medical examination without abnormal findings: Secondary | ICD-10-CM | POA: Diagnosis not present

## 2016-01-04 DIAGNOSIS — Z1389 Encounter for screening for other disorder: Secondary | ICD-10-CM | POA: Diagnosis not present

## 2016-01-04 DIAGNOSIS — C8590 Non-Hodgkin lymphoma, unspecified, unspecified site: Secondary | ICD-10-CM | POA: Diagnosis not present

## 2016-01-04 DIAGNOSIS — I480 Paroxysmal atrial fibrillation: Secondary | ICD-10-CM | POA: Diagnosis not present

## 2016-01-04 DIAGNOSIS — N401 Enlarged prostate with lower urinary tract symptoms: Secondary | ICD-10-CM | POA: Diagnosis not present

## 2016-01-04 DIAGNOSIS — G629 Polyneuropathy, unspecified: Secondary | ICD-10-CM | POA: Diagnosis not present

## 2016-01-04 DIAGNOSIS — R7301 Impaired fasting glucose: Secondary | ICD-10-CM | POA: Diagnosis not present

## 2016-01-04 DIAGNOSIS — Z6825 Body mass index (BMI) 25.0-25.9, adult: Secondary | ICD-10-CM | POA: Diagnosis not present

## 2016-01-04 DIAGNOSIS — G4700 Insomnia, unspecified: Secondary | ICD-10-CM | POA: Diagnosis not present

## 2016-01-19 DIAGNOSIS — M4716 Other spondylosis with myelopathy, lumbar region: Secondary | ICD-10-CM | POA: Diagnosis not present

## 2016-01-19 DIAGNOSIS — D485 Neoplasm of uncertain behavior of skin: Secondary | ICD-10-CM | POA: Diagnosis not present

## 2016-01-19 DIAGNOSIS — M5431 Sciatica, right side: Secondary | ICD-10-CM | POA: Diagnosis not present

## 2016-01-19 DIAGNOSIS — C44319 Basal cell carcinoma of skin of other parts of face: Secondary | ICD-10-CM | POA: Diagnosis not present

## 2016-01-19 DIAGNOSIS — M9903 Segmental and somatic dysfunction of lumbar region: Secondary | ICD-10-CM | POA: Diagnosis not present

## 2016-01-19 DIAGNOSIS — M47814 Spondylosis without myelopathy or radiculopathy, thoracic region: Secondary | ICD-10-CM | POA: Diagnosis not present

## 2016-01-19 DIAGNOSIS — M47892 Other spondylosis, cervical region: Secondary | ICD-10-CM | POA: Diagnosis not present

## 2016-01-19 DIAGNOSIS — M9902 Segmental and somatic dysfunction of thoracic region: Secondary | ICD-10-CM | POA: Diagnosis not present

## 2016-01-19 DIAGNOSIS — M9905 Segmental and somatic dysfunction of pelvic region: Secondary | ICD-10-CM | POA: Diagnosis not present

## 2016-01-19 DIAGNOSIS — C44729 Squamous cell carcinoma of skin of left lower limb, including hip: Secondary | ICD-10-CM | POA: Diagnosis not present

## 2016-01-23 DIAGNOSIS — M5431 Sciatica, right side: Secondary | ICD-10-CM | POA: Diagnosis not present

## 2016-01-23 DIAGNOSIS — M9905 Segmental and somatic dysfunction of pelvic region: Secondary | ICD-10-CM | POA: Diagnosis not present

## 2016-01-23 DIAGNOSIS — M4716 Other spondylosis with myelopathy, lumbar region: Secondary | ICD-10-CM | POA: Diagnosis not present

## 2016-01-23 DIAGNOSIS — M9903 Segmental and somatic dysfunction of lumbar region: Secondary | ICD-10-CM | POA: Diagnosis not present

## 2016-01-23 DIAGNOSIS — M9902 Segmental and somatic dysfunction of thoracic region: Secondary | ICD-10-CM | POA: Diagnosis not present

## 2016-01-23 DIAGNOSIS — M47814 Spondylosis without myelopathy or radiculopathy, thoracic region: Secondary | ICD-10-CM | POA: Diagnosis not present

## 2016-01-23 DIAGNOSIS — M47892 Other spondylosis, cervical region: Secondary | ICD-10-CM | POA: Diagnosis not present

## 2016-01-25 DIAGNOSIS — M47814 Spondylosis without myelopathy or radiculopathy, thoracic region: Secondary | ICD-10-CM | POA: Diagnosis not present

## 2016-01-25 DIAGNOSIS — M9902 Segmental and somatic dysfunction of thoracic region: Secondary | ICD-10-CM | POA: Diagnosis not present

## 2016-01-25 DIAGNOSIS — M5431 Sciatica, right side: Secondary | ICD-10-CM | POA: Diagnosis not present

## 2016-01-25 DIAGNOSIS — M9905 Segmental and somatic dysfunction of pelvic region: Secondary | ICD-10-CM | POA: Diagnosis not present

## 2016-01-25 DIAGNOSIS — M47892 Other spondylosis, cervical region: Secondary | ICD-10-CM | POA: Diagnosis not present

## 2016-01-25 DIAGNOSIS — M4716 Other spondylosis with myelopathy, lumbar region: Secondary | ICD-10-CM | POA: Diagnosis not present

## 2016-01-25 DIAGNOSIS — M9903 Segmental and somatic dysfunction of lumbar region: Secondary | ICD-10-CM | POA: Diagnosis not present

## 2016-01-30 DIAGNOSIS — M4716 Other spondylosis with myelopathy, lumbar region: Secondary | ICD-10-CM | POA: Diagnosis not present

## 2016-01-30 DIAGNOSIS — J324 Chronic pansinusitis: Secondary | ICD-10-CM | POA: Diagnosis not present

## 2016-01-30 DIAGNOSIS — M9905 Segmental and somatic dysfunction of pelvic region: Secondary | ICD-10-CM | POA: Diagnosis not present

## 2016-01-30 DIAGNOSIS — I4891 Unspecified atrial fibrillation: Secondary | ICD-10-CM | POA: Diagnosis not present

## 2016-01-30 DIAGNOSIS — M47892 Other spondylosis, cervical region: Secondary | ICD-10-CM | POA: Diagnosis not present

## 2016-01-30 DIAGNOSIS — C884 Extranodal marginal zone B-cell lymphoma of mucosa-associated lymphoid tissue [MALT-lymphoma]: Secondary | ICD-10-CM | POA: Diagnosis not present

## 2016-01-30 DIAGNOSIS — M9903 Segmental and somatic dysfunction of lumbar region: Secondary | ICD-10-CM | POA: Diagnosis not present

## 2016-01-30 DIAGNOSIS — R490 Dysphonia: Secondary | ICD-10-CM | POA: Diagnosis not present

## 2016-01-30 DIAGNOSIS — Z79899 Other long term (current) drug therapy: Secondary | ICD-10-CM | POA: Diagnosis not present

## 2016-01-30 DIAGNOSIS — M5431 Sciatica, right side: Secondary | ICD-10-CM | POA: Diagnosis not present

## 2016-01-30 DIAGNOSIS — D801 Nonfamilial hypogammaglobulinemia: Secondary | ICD-10-CM | POA: Diagnosis not present

## 2016-01-30 DIAGNOSIS — M9902 Segmental and somatic dysfunction of thoracic region: Secondary | ICD-10-CM | POA: Diagnosis not present

## 2016-01-30 DIAGNOSIS — Z7901 Long term (current) use of anticoagulants: Secondary | ICD-10-CM | POA: Diagnosis not present

## 2016-01-30 DIAGNOSIS — M47814 Spondylosis without myelopathy or radiculopathy, thoracic region: Secondary | ICD-10-CM | POA: Diagnosis not present

## 2016-01-30 DIAGNOSIS — D72818 Other decreased white blood cell count: Secondary | ICD-10-CM | POA: Diagnosis not present

## 2016-01-31 DIAGNOSIS — D801 Nonfamilial hypogammaglobulinemia: Secondary | ICD-10-CM | POA: Diagnosis not present

## 2016-02-28 DIAGNOSIS — D801 Nonfamilial hypogammaglobulinemia: Secondary | ICD-10-CM | POA: Diagnosis not present

## 2016-02-29 DIAGNOSIS — M5431 Sciatica, right side: Secondary | ICD-10-CM | POA: Diagnosis not present

## 2016-02-29 DIAGNOSIS — M47814 Spondylosis without myelopathy or radiculopathy, thoracic region: Secondary | ICD-10-CM | POA: Diagnosis not present

## 2016-02-29 DIAGNOSIS — M4716 Other spondylosis with myelopathy, lumbar region: Secondary | ICD-10-CM | POA: Diagnosis not present

## 2016-02-29 DIAGNOSIS — M9902 Segmental and somatic dysfunction of thoracic region: Secondary | ICD-10-CM | POA: Diagnosis not present

## 2016-02-29 DIAGNOSIS — M9905 Segmental and somatic dysfunction of pelvic region: Secondary | ICD-10-CM | POA: Diagnosis not present

## 2016-02-29 DIAGNOSIS — M9903 Segmental and somatic dysfunction of lumbar region: Secondary | ICD-10-CM | POA: Diagnosis not present

## 2016-02-29 DIAGNOSIS — M47892 Other spondylosis, cervical region: Secondary | ICD-10-CM | POA: Diagnosis not present

## 2016-03-01 DIAGNOSIS — Z23 Encounter for immunization: Secondary | ICD-10-CM | POA: Diagnosis not present

## 2016-03-05 DIAGNOSIS — C44319 Basal cell carcinoma of skin of other parts of face: Secondary | ICD-10-CM | POA: Diagnosis not present

## 2016-03-05 DIAGNOSIS — C44729 Squamous cell carcinoma of skin of left lower limb, including hip: Secondary | ICD-10-CM | POA: Diagnosis not present

## 2016-03-14 DIAGNOSIS — H9193 Unspecified hearing loss, bilateral: Secondary | ICD-10-CM | POA: Diagnosis not present

## 2016-03-14 DIAGNOSIS — J339 Nasal polyp, unspecified: Secondary | ICD-10-CM | POA: Diagnosis not present

## 2016-03-14 DIAGNOSIS — Z7901 Long term (current) use of anticoagulants: Secondary | ICD-10-CM | POA: Diagnosis not present

## 2016-03-14 DIAGNOSIS — Z9889 Other specified postprocedural states: Secondary | ICD-10-CM | POA: Diagnosis not present

## 2016-03-14 DIAGNOSIS — R0982 Postnasal drip: Secondary | ICD-10-CM | POA: Diagnosis not present

## 2016-03-14 DIAGNOSIS — I4891 Unspecified atrial fibrillation: Secondary | ICD-10-CM | POA: Diagnosis not present

## 2016-03-14 DIAGNOSIS — Z8572 Personal history of non-Hodgkin lymphomas: Secondary | ICD-10-CM | POA: Diagnosis not present

## 2016-03-14 DIAGNOSIS — Z950 Presence of cardiac pacemaker: Secondary | ICD-10-CM | POA: Diagnosis not present

## 2016-03-14 DIAGNOSIS — J324 Chronic pansinusitis: Secondary | ICD-10-CM | POA: Diagnosis not present

## 2016-03-14 DIAGNOSIS — R490 Dysphonia: Secondary | ICD-10-CM | POA: Diagnosis not present

## 2016-03-15 DIAGNOSIS — M47892 Other spondylosis, cervical region: Secondary | ICD-10-CM | POA: Diagnosis not present

## 2016-03-15 DIAGNOSIS — M9905 Segmental and somatic dysfunction of pelvic region: Secondary | ICD-10-CM | POA: Diagnosis not present

## 2016-03-15 DIAGNOSIS — M4716 Other spondylosis with myelopathy, lumbar region: Secondary | ICD-10-CM | POA: Diagnosis not present

## 2016-03-15 DIAGNOSIS — M5431 Sciatica, right side: Secondary | ICD-10-CM | POA: Diagnosis not present

## 2016-03-15 DIAGNOSIS — M9902 Segmental and somatic dysfunction of thoracic region: Secondary | ICD-10-CM | POA: Diagnosis not present

## 2016-03-15 DIAGNOSIS — M9903 Segmental and somatic dysfunction of lumbar region: Secondary | ICD-10-CM | POA: Diagnosis not present

## 2016-03-15 DIAGNOSIS — M47814 Spondylosis without myelopathy or radiculopathy, thoracic region: Secondary | ICD-10-CM | POA: Diagnosis not present

## 2016-03-27 DIAGNOSIS — I495 Sick sinus syndrome: Secondary | ICD-10-CM | POA: Diagnosis not present

## 2016-03-27 DIAGNOSIS — I480 Paroxysmal atrial fibrillation: Secondary | ICD-10-CM | POA: Diagnosis not present

## 2016-03-27 DIAGNOSIS — I2510 Atherosclerotic heart disease of native coronary artery without angina pectoris: Secondary | ICD-10-CM | POA: Diagnosis not present

## 2016-03-27 DIAGNOSIS — I359 Nonrheumatic aortic valve disorder, unspecified: Secondary | ICD-10-CM | POA: Diagnosis not present

## 2016-03-27 DIAGNOSIS — Z7901 Long term (current) use of anticoagulants: Secondary | ICD-10-CM | POA: Diagnosis not present

## 2016-03-27 DIAGNOSIS — Z950 Presence of cardiac pacemaker: Secondary | ICD-10-CM | POA: Diagnosis not present

## 2016-03-29 DIAGNOSIS — D801 Nonfamilial hypogammaglobulinemia: Secondary | ICD-10-CM | POA: Diagnosis not present

## 2016-04-24 DIAGNOSIS — J339 Nasal polyp, unspecified: Secondary | ICD-10-CM | POA: Diagnosis not present

## 2016-04-24 DIAGNOSIS — Z7901 Long term (current) use of anticoagulants: Secondary | ICD-10-CM | POA: Diagnosis not present

## 2016-04-24 DIAGNOSIS — I495 Sick sinus syndrome: Secondary | ICD-10-CM | POA: Diagnosis not present

## 2016-04-24 DIAGNOSIS — I4891 Unspecified atrial fibrillation: Secondary | ICD-10-CM | POA: Diagnosis not present

## 2016-04-24 DIAGNOSIS — R490 Dysphonia: Secondary | ICD-10-CM | POA: Diagnosis not present

## 2016-04-24 DIAGNOSIS — H9193 Unspecified hearing loss, bilateral: Secondary | ICD-10-CM | POA: Diagnosis not present

## 2016-04-24 DIAGNOSIS — K219 Gastro-esophageal reflux disease without esophagitis: Secondary | ICD-10-CM | POA: Diagnosis not present

## 2016-04-24 DIAGNOSIS — Z85828 Personal history of other malignant neoplasm of skin: Secondary | ICD-10-CM | POA: Diagnosis not present

## 2016-04-24 DIAGNOSIS — H918X3 Other specified hearing loss, bilateral: Secondary | ICD-10-CM | POA: Diagnosis not present

## 2016-04-24 DIAGNOSIS — J383 Other diseases of vocal cords: Secondary | ICD-10-CM | POA: Diagnosis not present

## 2016-04-24 DIAGNOSIS — D72819 Decreased white blood cell count, unspecified: Secondary | ICD-10-CM | POA: Diagnosis not present

## 2016-04-24 DIAGNOSIS — R0982 Postnasal drip: Secondary | ICD-10-CM | POA: Diagnosis not present

## 2016-04-24 DIAGNOSIS — J324 Chronic pansinusitis: Secondary | ICD-10-CM | POA: Diagnosis not present

## 2016-04-24 DIAGNOSIS — D801 Nonfamilial hypogammaglobulinemia: Secondary | ICD-10-CM | POA: Diagnosis not present

## 2016-04-24 DIAGNOSIS — Z8572 Personal history of non-Hodgkin lymphomas: Secondary | ICD-10-CM | POA: Diagnosis not present

## 2016-04-25 DIAGNOSIS — D1801 Hemangioma of skin and subcutaneous tissue: Secondary | ICD-10-CM | POA: Diagnosis not present

## 2016-04-25 DIAGNOSIS — M4716 Other spondylosis with myelopathy, lumbar region: Secondary | ICD-10-CM | POA: Diagnosis not present

## 2016-04-25 DIAGNOSIS — M9905 Segmental and somatic dysfunction of pelvic region: Secondary | ICD-10-CM | POA: Diagnosis not present

## 2016-04-25 DIAGNOSIS — D0362 Melanoma in situ of left upper limb, including shoulder: Secondary | ICD-10-CM | POA: Diagnosis not present

## 2016-04-25 DIAGNOSIS — M47892 Other spondylosis, cervical region: Secondary | ICD-10-CM | POA: Diagnosis not present

## 2016-04-25 DIAGNOSIS — L820 Inflamed seborrheic keratosis: Secondary | ICD-10-CM | POA: Diagnosis not present

## 2016-04-25 DIAGNOSIS — L814 Other melanin hyperpigmentation: Secondary | ICD-10-CM | POA: Diagnosis not present

## 2016-04-25 DIAGNOSIS — Z85828 Personal history of other malignant neoplasm of skin: Secondary | ICD-10-CM | POA: Diagnosis not present

## 2016-04-25 DIAGNOSIS — M9902 Segmental and somatic dysfunction of thoracic region: Secondary | ICD-10-CM | POA: Diagnosis not present

## 2016-04-25 DIAGNOSIS — D485 Neoplasm of uncertain behavior of skin: Secondary | ICD-10-CM | POA: Diagnosis not present

## 2016-04-25 DIAGNOSIS — M47814 Spondylosis without myelopathy or radiculopathy, thoracic region: Secondary | ICD-10-CM | POA: Diagnosis not present

## 2016-04-25 DIAGNOSIS — L570 Actinic keratosis: Secondary | ICD-10-CM | POA: Diagnosis not present

## 2016-04-25 DIAGNOSIS — L821 Other seborrheic keratosis: Secondary | ICD-10-CM | POA: Diagnosis not present

## 2016-04-25 DIAGNOSIS — M5431 Sciatica, right side: Secondary | ICD-10-CM | POA: Diagnosis not present

## 2016-04-25 DIAGNOSIS — C44529 Squamous cell carcinoma of skin of other part of trunk: Secondary | ICD-10-CM | POA: Diagnosis not present

## 2016-04-25 DIAGNOSIS — M9903 Segmental and somatic dysfunction of lumbar region: Secondary | ICD-10-CM | POA: Diagnosis not present

## 2016-04-26 DIAGNOSIS — C8590 Non-Hodgkin lymphoma, unspecified, unspecified site: Secondary | ICD-10-CM | POA: Diagnosis not present

## 2016-04-26 DIAGNOSIS — C884 Extranodal marginal zone B-cell lymphoma of mucosa-associated lymphoid tissue [MALT-lymphoma]: Secondary | ICD-10-CM | POA: Diagnosis not present

## 2016-05-17 DIAGNOSIS — L900 Lichen sclerosus et atrophicus: Secondary | ICD-10-CM | POA: Diagnosis not present

## 2016-05-17 DIAGNOSIS — D0359 Melanoma in situ of other part of trunk: Secondary | ICD-10-CM | POA: Diagnosis not present

## 2016-05-21 DIAGNOSIS — M4716 Other spondylosis with myelopathy, lumbar region: Secondary | ICD-10-CM | POA: Diagnosis not present

## 2016-05-21 DIAGNOSIS — M5431 Sciatica, right side: Secondary | ICD-10-CM | POA: Diagnosis not present

## 2016-05-21 DIAGNOSIS — M9903 Segmental and somatic dysfunction of lumbar region: Secondary | ICD-10-CM | POA: Diagnosis not present

## 2016-05-21 DIAGNOSIS — M9905 Segmental and somatic dysfunction of pelvic region: Secondary | ICD-10-CM | POA: Diagnosis not present

## 2016-05-22 DIAGNOSIS — C884 Extranodal marginal zone B-cell lymphoma of mucosa-associated lymphoid tissue [MALT-lymphoma]: Secondary | ICD-10-CM | POA: Diagnosis not present

## 2016-05-22 DIAGNOSIS — D801 Nonfamilial hypogammaglobulinemia: Secondary | ICD-10-CM | POA: Diagnosis not present

## 2016-05-22 DIAGNOSIS — D708 Other neutropenia: Secondary | ICD-10-CM | POA: Diagnosis not present

## 2016-06-20 DIAGNOSIS — D485 Neoplasm of uncertain behavior of skin: Secondary | ICD-10-CM | POA: Diagnosis not present

## 2016-06-20 DIAGNOSIS — D045 Carcinoma in situ of skin of trunk: Secondary | ICD-10-CM | POA: Diagnosis not present

## 2016-06-20 DIAGNOSIS — D225 Melanocytic nevi of trunk: Secondary | ICD-10-CM | POA: Diagnosis not present

## 2016-06-25 DIAGNOSIS — I480 Paroxysmal atrial fibrillation: Secondary | ICD-10-CM | POA: Diagnosis not present

## 2016-06-25 DIAGNOSIS — M9903 Segmental and somatic dysfunction of lumbar region: Secondary | ICD-10-CM | POA: Diagnosis not present

## 2016-06-25 DIAGNOSIS — M5431 Sciatica, right side: Secondary | ICD-10-CM | POA: Diagnosis not present

## 2016-06-25 DIAGNOSIS — M4716 Other spondylosis with myelopathy, lumbar region: Secondary | ICD-10-CM | POA: Diagnosis not present

## 2016-06-25 DIAGNOSIS — I2510 Atherosclerotic heart disease of native coronary artery without angina pectoris: Secondary | ICD-10-CM | POA: Diagnosis not present

## 2016-06-25 DIAGNOSIS — Z7901 Long term (current) use of anticoagulants: Secondary | ICD-10-CM | POA: Diagnosis not present

## 2016-06-25 DIAGNOSIS — M9905 Segmental and somatic dysfunction of pelvic region: Secondary | ICD-10-CM | POA: Diagnosis not present

## 2016-06-25 DIAGNOSIS — Z950 Presence of cardiac pacemaker: Secondary | ICD-10-CM | POA: Diagnosis not present

## 2016-06-25 DIAGNOSIS — I359 Nonrheumatic aortic valve disorder, unspecified: Secondary | ICD-10-CM | POA: Diagnosis not present

## 2016-06-25 DIAGNOSIS — I495 Sick sinus syndrome: Secondary | ICD-10-CM | POA: Diagnosis not present

## 2016-06-26 DIAGNOSIS — I2510 Atherosclerotic heart disease of native coronary artery without angina pectoris: Secondary | ICD-10-CM | POA: Diagnosis not present

## 2016-06-26 DIAGNOSIS — Z950 Presence of cardiac pacemaker: Secondary | ICD-10-CM | POA: Diagnosis not present

## 2016-06-26 DIAGNOSIS — Z7901 Long term (current) use of anticoagulants: Secondary | ICD-10-CM | POA: Diagnosis not present

## 2016-06-26 DIAGNOSIS — Z8572 Personal history of non-Hodgkin lymphomas: Secondary | ICD-10-CM | POA: Diagnosis not present

## 2016-06-26 DIAGNOSIS — I495 Sick sinus syndrome: Secondary | ICD-10-CM | POA: Diagnosis not present

## 2016-06-26 DIAGNOSIS — Z79899 Other long term (current) drug therapy: Secondary | ICD-10-CM | POA: Diagnosis not present

## 2016-06-26 DIAGNOSIS — J339 Nasal polyp, unspecified: Secondary | ICD-10-CM | POA: Diagnosis not present

## 2016-06-26 DIAGNOSIS — I359 Nonrheumatic aortic valve disorder, unspecified: Secondary | ICD-10-CM | POA: Diagnosis not present

## 2016-06-26 DIAGNOSIS — H903 Sensorineural hearing loss, bilateral: Secondary | ICD-10-CM | POA: Diagnosis not present

## 2016-06-26 DIAGNOSIS — I480 Paroxysmal atrial fibrillation: Secondary | ICD-10-CM | POA: Diagnosis not present

## 2016-06-26 DIAGNOSIS — I4891 Unspecified atrial fibrillation: Secondary | ICD-10-CM | POA: Diagnosis not present

## 2016-06-26 DIAGNOSIS — D801 Nonfamilial hypogammaglobulinemia: Secondary | ICD-10-CM | POA: Diagnosis not present

## 2016-06-26 DIAGNOSIS — R490 Dysphonia: Secondary | ICD-10-CM | POA: Diagnosis not present

## 2016-06-26 DIAGNOSIS — J324 Chronic pansinusitis: Secondary | ICD-10-CM | POA: Diagnosis not present

## 2016-07-19 ENCOUNTER — Emergency Department (HOSPITAL_COMMUNITY)
Admission: EM | Admit: 2016-07-19 | Discharge: 2016-07-19 | Disposition: A | Discharge status: Home / Self Care | Payer: Medicare Other | Attending: Emergency Medicine | Admitting: Emergency Medicine

## 2016-07-19 ENCOUNTER — Encounter (HOSPITAL_COMMUNITY): Payer: Self-pay

## 2016-07-19 DIAGNOSIS — Z79899 Other long term (current) drug therapy: Secondary | ICD-10-CM | POA: Diagnosis not present

## 2016-07-19 DIAGNOSIS — Z7901 Long term (current) use of anticoagulants: Secondary | ICD-10-CM | POA: Diagnosis not present

## 2016-07-19 DIAGNOSIS — K529 Noninfective gastroenteritis and colitis, unspecified: Secondary | ICD-10-CM | POA: Diagnosis not present

## 2016-07-19 DIAGNOSIS — I2510 Atherosclerotic heart disease of native coronary artery without angina pectoris: Secondary | ICD-10-CM | POA: Diagnosis not present

## 2016-07-19 DIAGNOSIS — Z950 Presence of cardiac pacemaker: Secondary | ICD-10-CM | POA: Insufficient documentation

## 2016-07-19 DIAGNOSIS — R1110 Vomiting, unspecified: Secondary | ICD-10-CM | POA: Diagnosis present

## 2016-07-19 LAB — COMPREHENSIVE METABOLIC PANEL
ALT: 26 U/L (ref 17–63)
AST: 34 U/L (ref 15–41)
Albumin: 3.8 g/dL (ref 3.5–5.0)
Alkaline Phosphatase: 67 U/L (ref 38–126)
Anion gap: 9 (ref 5–15)
BILIRUBIN TOTAL: 1 mg/dL (ref 0.3–1.2)
BUN: 18 mg/dL (ref 6–20)
CO2: 25 mmol/L (ref 22–32)
CREATININE: 1.12 mg/dL (ref 0.61–1.24)
Calcium: 8.8 mg/dL — ABNORMAL LOW (ref 8.9–10.3)
Chloride: 106 mmol/L (ref 101–111)
GFR calc Af Amer: >60 mL/min (ref >60)
GFR, EST NON AFRICAN AMERICAN: 58 mL/min — AB (ref >60)
Glucose, Bld: 105 mg/dL — ABNORMAL HIGH (ref 65–99)
POTASSIUM: 3.8 mmol/L (ref 3.5–5.1)
Sodium: 140 mmol/L (ref 135–145)
TOTAL PROTEIN: 6.4 g/dL — AB (ref 6.5–8.1)

## 2016-07-19 LAB — CBC
HCT: 41.7 % (ref 39.0–52.0)
Hemoglobin: 14.5 g/dL (ref 13.0–17.0)
MCH: 32.7 pg (ref 26.0–34.0)
MCHC: 34.8 g/dL (ref 30.0–36.0)
MCV: 93.9 fL (ref 78.0–100.0)
Platelets: 165 10*3/uL (ref 150–400)
RBC: 4.44 MIL/uL (ref 4.22–5.81)
RDW: 13.7 % (ref 11.5–15.5)
WBC: 2.9 10*3/uL — AB (ref 4.0–10.5)

## 2016-07-19 LAB — URINALYSIS, ROUTINE W REFLEX MICROSCOPIC
Bilirubin Urine: NEGATIVE
Glucose, UA: NEGATIVE mg/dL
Hgb urine dipstick: NEGATIVE
KETONES UR: NEGATIVE mg/dL
Leukocytes, UA: NEGATIVE
NITRITE: NEGATIVE
PROTEIN: NEGATIVE mg/dL
Specific Gravity, Urine: 1.023 (ref 1.005–1.030)
pH: 5 (ref 5.0–8.0)

## 2016-07-19 LAB — LIPASE, BLOOD: Lipase: 23 U/L (ref 11–51)

## 2016-07-19 MED ORDER — ONDANSETRON 4 MG PO TBDP: 4.0000 mg | ORAL_TABLET | ORAL | 0 refills | Status: AC | PRN

## 2016-07-19 NOTE — ED Provider Notes (Signed)
[facility] DEPT Provider Note   CSN: [id number] Arrival date & time: 07/19/16  [id number]     History   Chief Complaint Chief Complaint  Patient presents with  . Emesis  . Diarrhea    HPI Mark Robinson is a 83 y.o. male.  HPI Patient reports that he developed vomiting and diarrhea yesterday evening. He vomited approximate 4 times and had 14-15 episodes of diarrhea. He reports that now the vomiting has stopped and he has been able to drink fluids. He reports he is feeling better. He has not seen any blood in the stool. No associated abdominal pain. No fever. He denies feeling lightheaded or dizzy upon standing. Patient reports that he is caring for his son who just got admitted to the hospital. His son first had pneumonia symptoms but then developed diarrheal illness 2. He reports he was very careful wearing gloves and handwashing however he thinks, when his son has. He reports his main concern was because he is on immunosuppressant medications for lymphoma. Past Medical History:  Diagnosis Date  . CAD (coronary artery disease), native coronary artery 11/10/2013  . Cardiac pacemaker in situ    05/23/2006 Indications Tachy brady with PAF  Medtronic EnRhythm model [id number]   Seral number  [id number] H 05/29/2006 (repositioned because of increasing thresholds and concern for microperforation) RA lead Medtronic 5076 serial number [id number] RV lead  Medtronic 5076 serial number [id number],  (repositioned on septumfor microperforation)    . Long-term (current) use of anticoagulants   . 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 Pacemaker   . Paroxsymal atrial fibrillation    On flecainide CHADS2VASC score 4     Patient Active Problem List   Diagnosis Date Noted  . Lymphoma ([place]) 11/10/2013  . Cardiac pacemaker in situ   . Paroxsymal atrial fibrillation   . Long-term (current) use of anticoagulants   . CAD (coronary artery disease), native coronary artery     Past Surgical History:  Procedure Laterality Date  . APPENDECTOMY    . CARDIAC CATHETERIZATION    . FOREIGN BODY REMOVAL  05/23/2011   Procedure: FOREIGN BODY REMOVAL ADULT;  Surgeon: Wynonia Sours, MD;  Location: [facility];  Service: Orthopedics;  Laterality: Left;  left index finger  . HERNIA REPAIR    . INSERT / REPLACE / REMOVE PACEMAKER    . LESION REMOVAL  05/23/2011   Procedure: LESION REMOVAL;  Surgeon: Wynonia Sours, MD;  Location: [facility];  Service: Orthopedics;  Laterality: Left;  excision open lesion left index finger  . PERMANENT PACEMAKER GENERATOR CHANGE N/A 11/13/2013   Procedure: PERMANENT PACEMAKER GENERATOR CHANGE;  Surgeon: Evans Lance, MD;  Location: [facility] C[facility] LAB;  Service: Cardiovascular;  Laterality: N/A;  . TONSILLECTOMY         Home Medications    Prior to Admission medications   Medication Sig Start Date End Date Taking? Authorizing Provider  apixaban (ELIQUIS) 5 MG TABS tablet Take 5 mg by mouth 2 (two) times daily.   Yes Historical Provider, MD  b complex vitamins tablet Take 1 tablet by mouth daily.   Yes Historical Provider, MD  Cholecalciferol (VITAMIN D3) 1000 units CAPS Take 1,000 Units by mouth daily.   Yes Historical Provider, MD  flecainide (TAMBOCOR) 150 MG tablet Take 150 mg by mouth 2 (two) times daily.     Yes Historical Provider, MD  fluticasone (FLONASE) 50 MCG/ACT nasal spray Place 1-2 sprays into both nostrils daily as needed for allergies.    Yes Historical Provider, MD  Multiple Vitamin (MULTIVITAMIN) tablet Take 1 tablet by mouth daily.   Yes Historical  Provider, MD  omeprazole (PRILOSEC) 20 MG capsule Take 20 mg by mouth 2 (two) times daily. 06/23/16  Yes Historical Provider, MD  POTASSIUM GLUCONATE PO Take 500 mg by mouth daily as needed. supplement   Yes Historical Provider, MD  sulfamethoxazole-trimethoprim (BACTRIM,SEPTRA) 400-80 MG tablet Take 1 tablet by mouth daily. 04/29/16  Yes Historical Provider, MD  vardenafil (LEVITRA) 20 MG tablet Take 20 mg by mouth daily as needed for erectile dysfunction.   Yes Historical Provider, MD  vitamin C (ASCORBIC ACID) 500 MG tablet Take 500 mg by mouth daily.   Yes Historical Provider, MD  ondansetron (ZOFRAN ODT) 4 MG disintegrating tablet Take 1 tablet (4 mg total) by mouth every 4 (four) hours as needed for nausea or vomiting. 07/19/16   Charlesetta Shanks, MD    Family History No family history on file.  Social History Social History  Substance Use Topics  . Smoking status: Never Smoker  . Smokeless tobacco: Never Used  . Alcohol use Yes     Allergies   Patient has no known allergies.   Review of Systems Review of Systems  10 Systems reviewed and are negative for acute change except as noted in the HPI.  Physical Exam Updated Vital Signs BP 121/73   Pulse 62   Temp 98.9 F (37.2 C) (Oral)   Resp 14   Ht 5\' 10"  (1.778 m)   Wt 169 lb (76.7 kg)   SpO2 95%   BMI 24.25 kg/m   Physical Exam  Constitutional: He is oriented to person, place, and time. He appears well-developed and well-nourished.  HENT:  Head: Normocephalic and atraumatic.  Mouth/Throat: Oropharynx is clear and moist.  Eyes: Conjunctivae are normal.  Neck: Neck supple.  Cardiovascular: Normal rate and regular rhythm.   No murmur heard. Pulmonary/Chest: Effort normal and breath sounds normal. No respiratory distress.  Abdominal: Soft. He exhibits no distension. There is no tenderness.  Hyperactive bowel sounds.  Musculoskeletal: Normal range of motion. He exhibits no edema, tenderness or deformity.    Neurological: He is alert and oriented to person, place, and time. He exhibits normal muscle tone. Coordination normal.  Skin: Skin is warm and dry.  Psychiatric: He has a normal mood and affect.  Nursing note and vitals reviewed.    ED Treatments / Results  Labs (all labs ordered are listed, but only abnormal results are displayed) Labs Reviewed  COMPREHENSIVE METABOLIC PANEL - Abnormal; Notable for the following:       Result Value   Glucose, Bld 105 (*)    Calcium 8.8 (*)    Total Protein 6.4 (*)    GFR calc non Af Amer 58 (*)    All other components within normal limits  CBC - Abnormal; Notable for the following:    WBC 2.9 (*)    All other components within normal limits  LIPASE, BLOOD  URINALYSIS, ROUTINE W REFLEX MICROSCOPIC    EKG  EKG Interpretation None       Radiology No results found.  Procedures Procedures (including critical care time)  Medications Ordered in ED Medications - No data to display   Initial Impression / Assessment and Plan / ED Course  I have reviewed the triage vital signs and the nursing notes.  Pertinent labs & imaging results that were available during my care of the patient were reviewed by me and considered in my medical decision making (see chart for details).  Clinical Course     Final Clinical Impressions(s) / ED Diagnoses   Final diagnoses:  Gastroenteritis  The patient presents with symptoms consistent with gastritis. He reports the vomiting has stopped and he is feeling much better and has been able to take fluids and now. He reports one episode of diarrhea since to the emergency department. It seems to be slowing down. No associated pain or fever. Findings most consistent with viral gastroenteritis. Patient's main concern was due to his history of lymphoma and being on immunosuppressants that he could potentially get more ill. At this time, labs and vital signs are normal. Patient does not show signs of orthostasis and  seems to be improving at this time. Patient is aware of signs and symptoms for which return. Clinically and by diagnostic studies patient is stable to continue home hydration and return immediately if he develops pain, fever or worsening symptoms generally.  New Prescriptions New Prescriptions   ONDANSETRON (ZOFRAN ODT) 4 MG DISINTEGRATING TABLET    Take 1 tablet (4 mg total) by mouth every 4 (four) hours as needed for nausea or vomiting.     Charlesetta Shanks, MD 07/19/16 [phone]

## 2016-07-19 NOTE — ED Triage Notes (Signed)
Pt states that he is a cancer patient, states that he recently was caring for his son who was sick and last night he began to have n/v/d. Lower abd pain, denies fevers. Vomited X 4, diarrhea X 14-15 unrelieved by OTC medications.

## 2016-07-24 DIAGNOSIS — D801 Nonfamilial hypogammaglobulinemia: Secondary | ICD-10-CM | POA: Diagnosis not present

## 2016-07-25 DIAGNOSIS — C8590 Non-Hodgkin lymphoma, unspecified, unspecified site: Secondary | ICD-10-CM | POA: Diagnosis not present

## 2016-07-25 DIAGNOSIS — R7301 Impaired fasting glucose: Secondary | ICD-10-CM | POA: Diagnosis not present

## 2016-07-25 DIAGNOSIS — E784 Other hyperlipidemia: Secondary | ICD-10-CM | POA: Diagnosis not present

## 2016-07-25 DIAGNOSIS — I480 Paroxysmal atrial fibrillation: Secondary | ICD-10-CM | POA: Diagnosis not present

## 2016-07-25 DIAGNOSIS — C439 Malignant melanoma of skin, unspecified: Secondary | ICD-10-CM | POA: Diagnosis not present

## 2016-07-25 DIAGNOSIS — N401 Enlarged prostate with lower urinary tract symptoms: Secondary | ICD-10-CM | POA: Diagnosis not present

## 2016-07-25 DIAGNOSIS — Z1389 Encounter for screening for other disorder: Secondary | ICD-10-CM | POA: Diagnosis not present

## 2016-07-25 DIAGNOSIS — G629 Polyneuropathy, unspecified: Secondary | ICD-10-CM | POA: Diagnosis not present

## 2016-07-25 DIAGNOSIS — Z6824 Body mass index (BMI) 24.0-24.9, adult: Secondary | ICD-10-CM | POA: Diagnosis not present

## 2016-08-15 DIAGNOSIS — M4716 Other spondylosis with myelopathy, lumbar region: Secondary | ICD-10-CM | POA: Diagnosis not present

## 2016-08-15 DIAGNOSIS — M9903 Segmental and somatic dysfunction of lumbar region: Secondary | ICD-10-CM | POA: Diagnosis not present

## 2016-08-15 DIAGNOSIS — M9905 Segmental and somatic dysfunction of pelvic region: Secondary | ICD-10-CM | POA: Diagnosis not present

## 2016-08-15 DIAGNOSIS — M5431 Sciatica, right side: Secondary | ICD-10-CM | POA: Diagnosis not present

## 2016-08-28 DIAGNOSIS — Z461 Encounter for fitting and adjustment of hearing aid: Secondary | ICD-10-CM | POA: Diagnosis not present

## 2016-08-28 DIAGNOSIS — D801 Nonfamilial hypogammaglobulinemia: Secondary | ICD-10-CM | POA: Diagnosis not present

## 2016-08-29 DIAGNOSIS — L900 Lichen sclerosus et atrophicus: Secondary | ICD-10-CM | POA: Diagnosis not present

## 2016-08-29 DIAGNOSIS — D485 Neoplasm of uncertain behavior of skin: Secondary | ICD-10-CM | POA: Diagnosis not present

## 2016-09-24 DIAGNOSIS — M9903 Segmental and somatic dysfunction of lumbar region: Secondary | ICD-10-CM | POA: Diagnosis not present

## 2016-09-24 DIAGNOSIS — M9905 Segmental and somatic dysfunction of pelvic region: Secondary | ICD-10-CM | POA: Diagnosis not present

## 2016-09-24 DIAGNOSIS — M4716 Other spondylosis with myelopathy, lumbar region: Secondary | ICD-10-CM | POA: Diagnosis not present

## 2016-09-24 DIAGNOSIS — M5431 Sciatica, right side: Secondary | ICD-10-CM | POA: Diagnosis not present

## 2016-09-25 DIAGNOSIS — R0982 Postnasal drip: Secondary | ICD-10-CM | POA: Diagnosis not present

## 2016-09-25 DIAGNOSIS — D801 Nonfamilial hypogammaglobulinemia: Secondary | ICD-10-CM | POA: Diagnosis not present

## 2016-09-25 DIAGNOSIS — R490 Dysphonia: Secondary | ICD-10-CM | POA: Diagnosis not present

## 2016-09-25 DIAGNOSIS — H9193 Unspecified hearing loss, bilateral: Secondary | ICD-10-CM | POA: Diagnosis not present

## 2016-09-25 DIAGNOSIS — H918X3 Other specified hearing loss, bilateral: Secondary | ICD-10-CM | POA: Diagnosis not present

## 2016-09-25 DIAGNOSIS — J339 Nasal polyp, unspecified: Secondary | ICD-10-CM | POA: Diagnosis not present

## 2016-09-25 DIAGNOSIS — J324 Chronic pansinusitis: Secondary | ICD-10-CM | POA: Diagnosis not present

## 2016-09-26 DIAGNOSIS — I495 Sick sinus syndrome: Secondary | ICD-10-CM | POA: Diagnosis not present

## 2016-09-26 DIAGNOSIS — Z7901 Long term (current) use of anticoagulants: Secondary | ICD-10-CM | POA: Diagnosis not present

## 2016-09-26 DIAGNOSIS — I359 Nonrheumatic aortic valve disorder, unspecified: Secondary | ICD-10-CM | POA: Diagnosis not present

## 2016-09-26 DIAGNOSIS — I2510 Atherosclerotic heart disease of native coronary artery without angina pectoris: Secondary | ICD-10-CM | POA: Diagnosis not present

## 2016-09-26 DIAGNOSIS — Z950 Presence of cardiac pacemaker: Secondary | ICD-10-CM | POA: Diagnosis not present

## 2016-09-26 DIAGNOSIS — I480 Paroxysmal atrial fibrillation: Secondary | ICD-10-CM | POA: Diagnosis not present

## 2016-10-17 DIAGNOSIS — M9903 Segmental and somatic dysfunction of lumbar region: Secondary | ICD-10-CM | POA: Diagnosis not present

## 2016-10-17 DIAGNOSIS — M5431 Sciatica, right side: Secondary | ICD-10-CM | POA: Diagnosis not present

## 2016-10-17 DIAGNOSIS — M9905 Segmental and somatic dysfunction of pelvic region: Secondary | ICD-10-CM | POA: Diagnosis not present

## 2016-10-17 DIAGNOSIS — M4716 Other spondylosis with myelopathy, lumbar region: Secondary | ICD-10-CM | POA: Diagnosis not present

## 2016-10-22 DIAGNOSIS — M5431 Sciatica, right side: Secondary | ICD-10-CM | POA: Diagnosis not present

## 2016-10-22 DIAGNOSIS — M9903 Segmental and somatic dysfunction of lumbar region: Secondary | ICD-10-CM | POA: Diagnosis not present

## 2016-10-22 DIAGNOSIS — M9905 Segmental and somatic dysfunction of pelvic region: Secondary | ICD-10-CM | POA: Diagnosis not present

## 2016-10-22 DIAGNOSIS — M4716 Other spondylosis with myelopathy, lumbar region: Secondary | ICD-10-CM | POA: Diagnosis not present

## 2016-10-23 DIAGNOSIS — J383 Other diseases of vocal cords: Secondary | ICD-10-CM | POA: Diagnosis not present

## 2016-10-23 DIAGNOSIS — J384 Edema of larynx: Secondary | ICD-10-CM | POA: Diagnosis not present

## 2016-10-23 DIAGNOSIS — D801 Nonfamilial hypogammaglobulinemia: Secondary | ICD-10-CM | POA: Diagnosis not present

## 2016-10-23 DIAGNOSIS — R490 Dysphonia: Secondary | ICD-10-CM | POA: Diagnosis not present

## 2016-10-24 DIAGNOSIS — L821 Other seborrheic keratosis: Secondary | ICD-10-CM | POA: Diagnosis not present

## 2016-10-24 DIAGNOSIS — D485 Neoplasm of uncertain behavior of skin: Secondary | ICD-10-CM | POA: Diagnosis not present

## 2016-10-24 DIAGNOSIS — L814 Other melanin hyperpigmentation: Secondary | ICD-10-CM | POA: Diagnosis not present

## 2016-10-24 DIAGNOSIS — D0371 Melanoma in situ of right lower limb, including hip: Secondary | ICD-10-CM | POA: Diagnosis not present

## 2016-10-24 DIAGNOSIS — Z85820 Personal history of malignant melanoma of skin: Secondary | ICD-10-CM | POA: Diagnosis not present

## 2016-10-24 DIAGNOSIS — D1801 Hemangioma of skin and subcutaneous tissue: Secondary | ICD-10-CM | POA: Diagnosis not present

## 2016-10-24 DIAGNOSIS — Z85828 Personal history of other malignant neoplasm of skin: Secondary | ICD-10-CM | POA: Diagnosis not present

## 2016-10-24 DIAGNOSIS — C44212 Basal cell carcinoma of skin of right ear and external auricular canal: Secondary | ICD-10-CM | POA: Diagnosis not present

## 2016-11-20 DIAGNOSIS — D801 Nonfamilial hypogammaglobulinemia: Secondary | ICD-10-CM | POA: Diagnosis not present

## 2016-11-20 DIAGNOSIS — L905 Scar conditions and fibrosis of skin: Secondary | ICD-10-CM | POA: Diagnosis not present

## 2016-11-20 DIAGNOSIS — D0371 Melanoma in situ of right lower limb, including hip: Secondary | ICD-10-CM | POA: Diagnosis not present

## 2016-11-21 DIAGNOSIS — J383 Other diseases of vocal cords: Secondary | ICD-10-CM | POA: Diagnosis not present

## 2016-11-21 DIAGNOSIS — R490 Dysphonia: Secondary | ICD-10-CM | POA: Diagnosis not present

## 2016-12-11 DIAGNOSIS — M4716 Other spondylosis with myelopathy, lumbar region: Secondary | ICD-10-CM | POA: Diagnosis not present

## 2016-12-11 DIAGNOSIS — M9905 Segmental and somatic dysfunction of pelvic region: Secondary | ICD-10-CM | POA: Diagnosis not present

## 2016-12-11 DIAGNOSIS — M9903 Segmental and somatic dysfunction of lumbar region: Secondary | ICD-10-CM | POA: Diagnosis not present

## 2016-12-11 DIAGNOSIS — M5431 Sciatica, right side: Secondary | ICD-10-CM | POA: Diagnosis not present

## 2016-12-24 DIAGNOSIS — I480 Paroxysmal atrial fibrillation: Secondary | ICD-10-CM | POA: Diagnosis not present

## 2016-12-24 DIAGNOSIS — I495 Sick sinus syndrome: Secondary | ICD-10-CM | POA: Diagnosis not present

## 2016-12-24 DIAGNOSIS — I359 Nonrheumatic aortic valve disorder, unspecified: Secondary | ICD-10-CM | POA: Diagnosis not present

## 2016-12-24 DIAGNOSIS — Z950 Presence of cardiac pacemaker: Secondary | ICD-10-CM | POA: Diagnosis not present

## 2016-12-24 DIAGNOSIS — Z7901 Long term (current) use of anticoagulants: Secondary | ICD-10-CM | POA: Diagnosis not present

## 2016-12-24 DIAGNOSIS — I2510 Atherosclerotic heart disease of native coronary artery without angina pectoris: Secondary | ICD-10-CM | POA: Diagnosis not present

## 2016-12-25 DIAGNOSIS — D801 Nonfamilial hypogammaglobulinemia: Secondary | ICD-10-CM | POA: Diagnosis not present

## 2016-12-26 DIAGNOSIS — Z950 Presence of cardiac pacemaker: Secondary | ICD-10-CM | POA: Diagnosis not present

## 2016-12-26 DIAGNOSIS — I480 Paroxysmal atrial fibrillation: Secondary | ICD-10-CM | POA: Diagnosis not present

## 2016-12-26 DIAGNOSIS — I495 Sick sinus syndrome: Secondary | ICD-10-CM | POA: Diagnosis not present

## 2016-12-26 DIAGNOSIS — I359 Nonrheumatic aortic valve disorder, unspecified: Secondary | ICD-10-CM | POA: Diagnosis not present

## 2016-12-26 DIAGNOSIS — C44212 Basal cell carcinoma of skin of right ear and external auricular canal: Secondary | ICD-10-CM | POA: Diagnosis not present

## 2016-12-26 DIAGNOSIS — Z7901 Long term (current) use of anticoagulants: Secondary | ICD-10-CM | POA: Diagnosis not present

## 2016-12-26 DIAGNOSIS — I2510 Atherosclerotic heart disease of native coronary artery without angina pectoris: Secondary | ICD-10-CM | POA: Diagnosis not present

## 2017-01-07 DIAGNOSIS — S01301A Unspecified open wound of right ear, initial encounter: Secondary | ICD-10-CM | POA: Diagnosis not present

## 2017-01-07 DIAGNOSIS — Z08 Encounter for follow-up examination after completed treatment for malignant neoplasm: Secondary | ICD-10-CM | POA: Diagnosis not present

## 2017-01-07 DIAGNOSIS — Z85820 Personal history of malignant melanoma of skin: Secondary | ICD-10-CM | POA: Diagnosis not present

## 2017-01-14 DIAGNOSIS — Z79899 Other long term (current) drug therapy: Secondary | ICD-10-CM | POA: Diagnosis not present

## 2017-01-14 DIAGNOSIS — I480 Paroxysmal atrial fibrillation: Secondary | ICD-10-CM | POA: Diagnosis not present

## 2017-01-14 DIAGNOSIS — R7301 Impaired fasting glucose: Secondary | ICD-10-CM | POA: Diagnosis not present

## 2017-01-14 DIAGNOSIS — E784 Other hyperlipidemia: Secondary | ICD-10-CM | POA: Diagnosis not present

## 2017-01-16 DIAGNOSIS — Z1212 Encounter for screening for malignant neoplasm of rectum: Secondary | ICD-10-CM | POA: Diagnosis not present

## 2017-01-21 DIAGNOSIS — E784 Other hyperlipidemia: Secondary | ICD-10-CM | POA: Diagnosis not present

## 2017-01-21 DIAGNOSIS — Z6825 Body mass index (BMI) 25.0-25.9, adult: Secondary | ICD-10-CM | POA: Diagnosis not present

## 2017-01-21 DIAGNOSIS — R7302 Impaired glucose tolerance (oral): Secondary | ICD-10-CM | POA: Diagnosis not present

## 2017-01-21 DIAGNOSIS — S01301A Unspecified open wound of right ear, initial encounter: Secondary | ICD-10-CM | POA: Diagnosis not present

## 2017-01-21 DIAGNOSIS — C8590 Non-Hodgkin lymphoma, unspecified, unspecified site: Secondary | ICD-10-CM | POA: Diagnosis not present

## 2017-01-21 DIAGNOSIS — Z0000 Encounter for general adult medical examination without abnormal findings: Secondary | ICD-10-CM | POA: Diagnosis not present

## 2017-01-21 DIAGNOSIS — G6289 Other specified polyneuropathies: Secondary | ICD-10-CM | POA: Diagnosis not present

## 2017-01-21 DIAGNOSIS — C439 Malignant melanoma of skin, unspecified: Secondary | ICD-10-CM | POA: Diagnosis not present

## 2017-01-21 DIAGNOSIS — I480 Paroxysmal atrial fibrillation: Secondary | ICD-10-CM | POA: Diagnosis not present

## 2017-01-21 DIAGNOSIS — G4709 Other insomnia: Secondary | ICD-10-CM | POA: Diagnosis not present

## 2017-01-21 DIAGNOSIS — N401 Enlarged prostate with lower urinary tract symptoms: Secondary | ICD-10-CM | POA: Diagnosis not present

## 2017-01-22 DIAGNOSIS — H918X3 Other specified hearing loss, bilateral: Secondary | ICD-10-CM | POA: Diagnosis not present

## 2017-01-22 DIAGNOSIS — R0982 Postnasal drip: Secondary | ICD-10-CM | POA: Diagnosis not present

## 2017-01-22 DIAGNOSIS — J324 Chronic pansinusitis: Secondary | ICD-10-CM | POA: Diagnosis not present

## 2017-01-22 DIAGNOSIS — Z974 Presence of external hearing-aid: Secondary | ICD-10-CM | POA: Diagnosis not present

## 2017-01-22 DIAGNOSIS — D801 Nonfamilial hypogammaglobulinemia: Secondary | ICD-10-CM | POA: Diagnosis not present

## 2017-01-22 DIAGNOSIS — R490 Dysphonia: Secondary | ICD-10-CM | POA: Diagnosis not present

## 2017-01-22 DIAGNOSIS — J339 Nasal polyp, unspecified: Secondary | ICD-10-CM | POA: Diagnosis not present

## 2017-02-25 DIAGNOSIS — Z6825 Body mass index (BMI) 25.0-25.9, adult: Secondary | ICD-10-CM | POA: Diagnosis not present

## 2017-02-25 DIAGNOSIS — R21 Rash and other nonspecific skin eruption: Secondary | ICD-10-CM | POA: Diagnosis not present

## 2017-02-26 DIAGNOSIS — D801 Nonfamilial hypogammaglobulinemia: Secondary | ICD-10-CM | POA: Diagnosis not present

## 2017-02-26 DIAGNOSIS — Z79899 Other long term (current) drug therapy: Secondary | ICD-10-CM | POA: Diagnosis not present

## 2017-02-27 DIAGNOSIS — N5201 Erectile dysfunction due to arterial insufficiency: Secondary | ICD-10-CM | POA: Diagnosis not present

## 2017-02-27 DIAGNOSIS — C4442 Squamous cell carcinoma of skin of scalp and neck: Secondary | ICD-10-CM | POA: Diagnosis not present

## 2017-02-27 DIAGNOSIS — N401 Enlarged prostate with lower urinary tract symptoms: Secondary | ICD-10-CM | POA: Diagnosis not present

## 2017-02-27 DIAGNOSIS — D485 Neoplasm of uncertain behavior of skin: Secondary | ICD-10-CM | POA: Diagnosis not present

## 2017-02-27 DIAGNOSIS — R351 Nocturia: Secondary | ICD-10-CM | POA: Diagnosis not present

## 2017-02-27 DIAGNOSIS — L309 Dermatitis, unspecified: Secondary | ICD-10-CM | POA: Diagnosis not present

## 2017-03-01 DIAGNOSIS — M9903 Segmental and somatic dysfunction of lumbar region: Secondary | ICD-10-CM | POA: Diagnosis not present

## 2017-03-01 DIAGNOSIS — M4716 Other spondylosis with myelopathy, lumbar region: Secondary | ICD-10-CM | POA: Diagnosis not present

## 2017-03-01 DIAGNOSIS — M9905 Segmental and somatic dysfunction of pelvic region: Secondary | ICD-10-CM | POA: Diagnosis not present

## 2017-03-01 DIAGNOSIS — M5431 Sciatica, right side: Secondary | ICD-10-CM | POA: Diagnosis not present

## 2017-03-22 DIAGNOSIS — Z23 Encounter for immunization: Secondary | ICD-10-CM | POA: Diagnosis not present

## 2017-03-25 DIAGNOSIS — M9903 Segmental and somatic dysfunction of lumbar region: Secondary | ICD-10-CM | POA: Diagnosis not present

## 2017-03-25 DIAGNOSIS — M5431 Sciatica, right side: Secondary | ICD-10-CM | POA: Diagnosis not present

## 2017-03-25 DIAGNOSIS — M9905 Segmental and somatic dysfunction of pelvic region: Secondary | ICD-10-CM | POA: Diagnosis not present

## 2017-03-25 DIAGNOSIS — M4716 Other spondylosis with myelopathy, lumbar region: Secondary | ICD-10-CM | POA: Diagnosis not present

## 2017-03-25 DIAGNOSIS — C4442 Squamous cell carcinoma of skin of scalp and neck: Secondary | ICD-10-CM | POA: Diagnosis not present

## 2017-03-26 DIAGNOSIS — D801 Nonfamilial hypogammaglobulinemia: Secondary | ICD-10-CM | POA: Diagnosis not present

## 2017-03-27 DIAGNOSIS — Z7901 Long term (current) use of anticoagulants: Secondary | ICD-10-CM | POA: Diagnosis not present

## 2017-03-27 DIAGNOSIS — I480 Paroxysmal atrial fibrillation: Secondary | ICD-10-CM | POA: Diagnosis not present

## 2017-03-27 DIAGNOSIS — I2510 Atherosclerotic heart disease of native coronary artery without angina pectoris: Secondary | ICD-10-CM | POA: Diagnosis not present

## 2017-03-27 DIAGNOSIS — Z950 Presence of cardiac pacemaker: Secondary | ICD-10-CM | POA: Diagnosis not present

## 2017-03-27 DIAGNOSIS — I495 Sick sinus syndrome: Secondary | ICD-10-CM | POA: Diagnosis not present

## 2017-03-27 DIAGNOSIS — I359 Nonrheumatic aortic valve disorder, unspecified: Secondary | ICD-10-CM | POA: Diagnosis not present

## 2017-04-03 DIAGNOSIS — Z6825 Body mass index (BMI) 25.0-25.9, adult: Secondary | ICD-10-CM | POA: Diagnosis not present

## 2017-04-03 DIAGNOSIS — R1012 Left upper quadrant pain: Secondary | ICD-10-CM | POA: Diagnosis not present

## 2017-04-03 DIAGNOSIS — R071 Chest pain on breathing: Secondary | ICD-10-CM | POA: Diagnosis not present

## 2017-04-03 DIAGNOSIS — I480 Paroxysmal atrial fibrillation: Secondary | ICD-10-CM | POA: Diagnosis not present

## 2017-04-23 DIAGNOSIS — D801 Nonfamilial hypogammaglobulinemia: Secondary | ICD-10-CM | POA: Diagnosis not present

## 2017-04-23 DIAGNOSIS — K219 Gastro-esophageal reflux disease without esophagitis: Secondary | ICD-10-CM | POA: Diagnosis not present

## 2017-04-23 DIAGNOSIS — R490 Dysphonia: Secondary | ICD-10-CM | POA: Diagnosis not present

## 2017-04-23 DIAGNOSIS — Z79899 Other long term (current) drug therapy: Secondary | ICD-10-CM | POA: Diagnosis not present

## 2017-04-23 DIAGNOSIS — B3789 Other sites of candidiasis: Secondary | ICD-10-CM | POA: Diagnosis not present

## 2017-04-30 DIAGNOSIS — L280 Lichen simplex chronicus: Secondary | ICD-10-CM | POA: Diagnosis not present

## 2017-04-30 DIAGNOSIS — L820 Inflamed seborrheic keratosis: Secondary | ICD-10-CM | POA: Diagnosis not present

## 2017-04-30 DIAGNOSIS — D485 Neoplasm of uncertain behavior of skin: Secondary | ICD-10-CM | POA: Diagnosis not present

## 2017-04-30 DIAGNOSIS — L570 Actinic keratosis: Secondary | ICD-10-CM | POA: Diagnosis not present

## 2017-04-30 DIAGNOSIS — D1801 Hemangioma of skin and subcutaneous tissue: Secondary | ICD-10-CM | POA: Diagnosis not present

## 2017-04-30 DIAGNOSIS — Z85820 Personal history of malignant melanoma of skin: Secondary | ICD-10-CM | POA: Diagnosis not present

## 2017-04-30 DIAGNOSIS — Z85828 Personal history of other malignant neoplasm of skin: Secondary | ICD-10-CM | POA: Diagnosis not present

## 2017-04-30 DIAGNOSIS — L821 Other seborrheic keratosis: Secondary | ICD-10-CM | POA: Diagnosis not present

## 2017-04-30 DIAGNOSIS — L814 Other melanin hyperpigmentation: Secondary | ICD-10-CM | POA: Diagnosis not present

## 2017-05-14 ENCOUNTER — Other Ambulatory Visit: Payer: Self-pay | Admitting: Cardiology

## 2017-05-14 ENCOUNTER — Ambulatory Visit
Admission: RE | Admit: 2017-05-14 | Discharge: 2017-05-14 | Disposition: A | Discharge status: Home / Self Care | Payer: Medicare Other | Source: Ambulatory Visit | Attending: Cardiology | Admitting: Cardiology

## 2017-05-14 DIAGNOSIS — I480 Paroxysmal atrial fibrillation: Secondary | ICD-10-CM | POA: Diagnosis not present

## 2017-05-14 DIAGNOSIS — I495 Sick sinus syndrome: Secondary | ICD-10-CM | POA: Diagnosis not present

## 2017-05-14 DIAGNOSIS — I359 Nonrheumatic aortic valve disorder, unspecified: Secondary | ICD-10-CM | POA: Diagnosis not present

## 2017-05-14 DIAGNOSIS — R0602 Shortness of breath: Secondary | ICD-10-CM | POA: Diagnosis not present

## 2017-05-14 DIAGNOSIS — R0600 Dyspnea, unspecified: Secondary | ICD-10-CM

## 2017-05-14 DIAGNOSIS — I2510 Atherosclerotic heart disease of native coronary artery without angina pectoris: Secondary | ICD-10-CM | POA: Diagnosis not present

## 2017-05-14 DIAGNOSIS — Z7901 Long term (current) use of anticoagulants: Secondary | ICD-10-CM | POA: Diagnosis not present

## 2017-05-14 DIAGNOSIS — Z950 Presence of cardiac pacemaker: Secondary | ICD-10-CM | POA: Diagnosis not present

## 2017-05-15 ENCOUNTER — Other Ambulatory Visit: Payer: Self-pay | Admitting: Cardiology

## 2017-05-15 DIAGNOSIS — K5730 Diverticulosis of large intestine without perforation or abscess without bleeding: Secondary | ICD-10-CM | POA: Diagnosis not present

## 2017-05-15 DIAGNOSIS — K8020 Calculus of gallbladder without cholecystitis without obstruction: Secondary | ICD-10-CM | POA: Diagnosis not present

## 2017-05-15 DIAGNOSIS — C884 Extranodal marginal zone B-cell lymphoma of mucosa-associated lymphoid tissue [MALT-lymphoma]: Secondary | ICD-10-CM | POA: Diagnosis not present

## 2017-05-15 DIAGNOSIS — Z950 Presence of cardiac pacemaker: Secondary | ICD-10-CM | POA: Diagnosis not present

## 2017-05-15 DIAGNOSIS — N400 Enlarged prostate without lower urinary tract symptoms: Secondary | ICD-10-CM | POA: Diagnosis not present

## 2017-05-15 DIAGNOSIS — D3501 Benign neoplasm of right adrenal gland: Secondary | ICD-10-CM | POA: Diagnosis not present

## 2017-05-15 DIAGNOSIS — N281 Cyst of kidney, acquired: Secondary | ICD-10-CM | POA: Diagnosis not present

## 2017-05-15 DIAGNOSIS — K769 Liver disease, unspecified: Secondary | ICD-10-CM | POA: Diagnosis not present

## 2017-05-15 DIAGNOSIS — I700 Atherosclerosis of aorta: Secondary | ICD-10-CM | POA: Diagnosis not present

## 2017-05-15 DIAGNOSIS — N289 Disorder of kidney and ureter, unspecified: Secondary | ICD-10-CM | POA: Diagnosis not present

## 2017-05-15 DIAGNOSIS — I2510 Atherosclerotic heart disease of native coronary artery without angina pectoris: Secondary | ICD-10-CM

## 2017-05-15 DIAGNOSIS — N2889 Other specified disorders of kidney and ureter: Secondary | ICD-10-CM | POA: Diagnosis not present

## 2017-05-15 NOTE — Progress Notes (Signed)
Mark Robinson  Date of visit:  05/14/2017 DOB:  [birth year]    Age:  83 yrs. Medical record number:  [id number]     Account number:  [id number] Primary Care Provider: Burnard Bunting A ____________________________ CURRENT DIAGNOSES  1. Paroxysmal atrial fibrillation  2. Dyspnea  3. Sick sinus syndrome  4. CAD Native without angina  5. Long term (current) use of anticoagulants  6. Presence of cardiac pacemaker  7. Nonrheumatic aortic valve disorder, unspecified  8. Other Malignant Lymphomas Involving Intra-abdominal Lymph Nodes ____________________________ ALLERGIES  No Known Allergies ____________________________ MEDICATIONS  1. Bactrim 400 mg-80 mg tablet, 1 p.o. daily  2. Claritin 10 mg tablet, PRN  3. Eliquis 5 mg tablet, BID  4. flecainide 150 mg tablet, BID  5. fluticasone 50 mcg/actuation Spray, Suspension, PRN  6. Levitra 20 mg Tablet, PRN  7. potassium gluconate 500 mg (83 mg) tablet, PRN  8. Probiotic 10 billion cell capsule, 1 p.o. daily  9. vitamin B complex tablet, 1 p.o. daily  10. Vitamin C 500 mg tablet, 1 p.o. daily  11. Vitamin D3 1,000 unit tablet, 1 p.o. daily ____________________________ HISTORY OF PRESENT ILLNESS Patient returns early with his wife for evaluation of progressive exertional fatigue and dyspnea. The patient comes in early with complaints of increasing malaise fatigue lack of energy and worsening dyspnea. His wife notes that he has had less interest in doing things and tends to lag behind in groups. Activities that were previously normal for him to do have been difficult to do and he complains of reduced exercise capacity as well as worsening exercise tolerance. He had difficulty walking up a hill and finds it difficult to walk up several flights of steps. He may have some mild edema at occasional times. He denies exertional type chest discomfort. He does have a previous history of lymphoma and is receiving immunoglobulin injections. He has not had  recent imaging in some time. He denies PND or orthopnea. He is somewhat vague about the onset of duration of the symptoms but states that has occurred maybe over the past month or so. He had has a prior history of coronary artery disease at catheterization 14 years ago. He has known calcification involving his aorta and his coronary arteries on CT scan. His last imaging for his lymphoma was around one year ago. ____________________________ PAST HISTORY  Past Medical Illnesses:  non-Hodgkin's Lyphoma 2001 with recurrence in 2006 treated with Vincristine, Cytoxan, and Prednisone and Rytuxan, diverticulosis;  Cardiovascular Illnesses:  atrial fibrillation 1996, sick sinus syndrome;  Surgical Procedures:  Bil ing hernia, appendectomy, tonsillectomy;  NYHA Classification:  I;  Canadian Angina Classification:  Class 0: Asymptomatic;  Cardiology Procedures-Invasive:  cardiac cath (left) December 1994, cardiac cath (left) October 2004, Medtronic pacemaker implant November 2007, insertion of new right ventricular lead for microperforation in November of 2007 by Dr. Lovena Le, Medtronic generator change May 2015;  Cardiology Procedures-Noninvasive:  treadmill cardiolite December 2003, treadmill October 2007, echocardiogram Robinson 2009, echocardiogram June 2012, lexiscan cardiolite November 2013;  Cardiac Cath Results:  no significant disease;  LVEF of 56% documented via nuclear study on 05/22/2012,  CHADS Score:  1,  CHA2DS2-VASC Score:  4 ____________________________ CARDIO-PULMONARY TEST DATES EKG Date:  12/24/2016;   Cardiac Cath Date:  04/19/2003;  Nuclear Study Date:  05/22/2012;  Echocardiography Date: 12/27/2010;   ____________________________ FAMILY HISTORY Father -- Father dead, Myocardial infarction, Diabetes mellitus Mother -- Mother dead, CVA, Myocardial infarction Sister -- Sister alive and well Brother -- Brother alive  with problem, Diabetes mellitus ____________________________ SOCIAL HISTORY Alcohol  Use:  drinks occasionally;  Smoking:  never smoked;  Diet:  regular diet without modifications;  Lifestyle:  married;  Exercise:  some exercise;  Occupation:  retired;  Residence:  lives with wife;  Job Description:  Clinical biochemist;   ____________________________ REVIEW OF SYSTEMS General:  weight gain of approximately 5 lbs  Integumentary:treatment for skin cancer Eyes: wears eye glasses/contact lenses Ears, Nose, Throat, Mouth:  hoarseness Respiratory: denies dyspnea, cough, wheezing or hemoptysis. Cardiovascular:  please review HPI Abdominal: denies dyspepsia, GI bleeding, constipation, or diarrhea Genitourinary-Male: hesitancy  Musculoskeletal:  denies arthritis, venous insufficiency, or muscle weakness Neurological:  numbness of toes and fingers Psychiatric:  insomnia  ____________________________ PHYSICAL EXAMINATION VITAL SIGNS  Blood Pressure:  116/70 Sitting, Left arm, regular cuff  , 100/70 Standing, Left arm and regular cuff   Pulse:  68/min. Weight:  176.00 lbs. Height:  70"BMI: 25  Constitutional:  pleasant white male in no acute distress Skin:  skin cancer removal scars Head:  normocephalic, normal hair pattern, no masses or tenderness Eyes:  EOMS Intact, PERRLA, C and S clear, Funduscopic exam not done. ENT:  ears, nose and throat reveal no gross abnormalities.  Dentition good. Neck:  supple, no masses, thyromegaly, JVD. Carotid pulses are full and equal bilaterally without bruits. Chest:  normal symmetry, clear to auscultation, healed pacemaker incision in the left pectoral area Cardiac:  normal S1 and S2, no S3 or S4, no murmurs,clicks, or rub heard, irregular rhythm Abdomen:  abdomen soft,non-tender, no masses, no hepatospenomegaly, or aneurysm noted Peripheral Pulses:  the femoral,dorsalis pedis, and posterior tibial pulses are full and equal bilaterally with no bruits auscultated. Extremities & Back:  RLE venous varicosities, left upper leg varicosities, no edema  present Neurological:  no gross motor or sensory deficits noted, affect appropriate, oriented x3. ____________________________ MOST RECENT LIPID PANEL 12/03/14  CHOL TOTL 192 mg/dl, LDL 126 NM, HDL 53 mg/dl and TRIGLYCER 63 mg/dl ____________________________ IMPRESSIONS/PLAN  1. Progressive exertional dyspnea fatigue and exercise intolerance 2. CAD previously mild 3. History of paroxysmal atrial fibrillation currently in normal sinus rhythm 4. Functioning permanent pacemaker 5. Long-term use of anticoagulation 6. History of lymphoma currently under treatment  Recommendations:  Symptoms are somewhat vague. His 12-lead EKG personally reviewed by me shows normal sinus rhythm with a paced ventricular rhythm. He will be 78 on his next birthday but has been physically active. I recommended lab work as noted below as well as chest x-ray and would like him to start with a echocardiogram to assess LV function and valvular function. I think it is would be best for him to go ahead and have a cardiac CTA if there is not too much cardiac calcification in the coronary arteries. He has been having most of his imaging done for followup of this lymphoma at [facility] and I gave him a note asking him to have this done there. They're not able to do this and we will try to obtain authorization have it done here in town. The other option would be nuclear stress testing. He does have known CAD and with a recent change of symptoms I think direct imaging would be preferable.  Greater than 40 minutes spent with patient including greater than 50% of time spent face-to-face including counseling/coordination of care with discussion of dyspnea, differential diagnosis, approached treatment and various treatment options for evaluation. ___________________________ TODAYS ORDERS  1. 2D, color flow, doppler: First Available  2. Comprehensive Metabolic Panel: Today  3. Complete Blood Count: Today  4. BNP: Today  5. TSH:  Today  6. 12 Lead EKG: Today  7. Chest X-ray PA/Lat: today  8. CHEST XRAY: Today  9. Cardiac CTA                      ____________________________ Cardiology Physician:  Kerry Hough MD [facility]

## 2017-05-17 DIAGNOSIS — R0609 Other forms of dyspnea: Secondary | ICD-10-CM | POA: Diagnosis not present

## 2017-05-21 DIAGNOSIS — D61818 Other pancytopenia: Secondary | ICD-10-CM | POA: Diagnosis not present

## 2017-05-21 DIAGNOSIS — D801 Nonfamilial hypogammaglobulinemia: Secondary | ICD-10-CM | POA: Diagnosis not present

## 2017-05-21 DIAGNOSIS — C884 Extranodal marginal zone B-cell lymphoma of mucosa-associated lymphoid tissue [MALT-lymphoma]: Secondary | ICD-10-CM | POA: Diagnosis not present

## 2017-05-23 DIAGNOSIS — D759 Disease of blood and blood-forming organs, unspecified: Secondary | ICD-10-CM | POA: Diagnosis not present

## 2017-05-23 DIAGNOSIS — D708 Other neutropenia: Secondary | ICD-10-CM | POA: Diagnosis not present

## 2017-05-23 DIAGNOSIS — D61818 Other pancytopenia: Secondary | ICD-10-CM | POA: Diagnosis not present

## 2017-05-23 DIAGNOSIS — D469 Myelodysplastic syndrome, unspecified: Secondary | ICD-10-CM | POA: Diagnosis not present

## 2017-05-23 DIAGNOSIS — D709 Neutropenia, unspecified: Secondary | ICD-10-CM | POA: Diagnosis not present

## 2017-05-23 DIAGNOSIS — C884 Extranodal marginal zone B-cell lymphoma of mucosa-associated lymphoid tissue [MALT-lymphoma]: Secondary | ICD-10-CM | POA: Diagnosis not present

## 2017-06-01 DIAGNOSIS — C8590 Non-Hodgkin lymphoma, unspecified, unspecified site: Secondary | ICD-10-CM | POA: Diagnosis not present

## 2017-06-01 DIAGNOSIS — L309 Dermatitis, unspecified: Secondary | ICD-10-CM | POA: Diagnosis not present

## 2017-06-01 DIAGNOSIS — D72819 Decreased white blood cell count, unspecified: Secondary | ICD-10-CM | POA: Diagnosis not present

## 2017-06-14 ENCOUNTER — Ambulatory Visit (HOSPITAL_COMMUNITY)
Admission: RE | Admit: 2017-06-14 | Discharge: 2017-06-14 | Disposition: A | Discharge status: Home / Self Care | Payer: Medicare Other | Source: Ambulatory Visit | Attending: Cardiology | Admitting: Cardiology

## 2017-06-14 DIAGNOSIS — D469 Myelodysplastic syndrome, unspecified: Secondary | ICD-10-CM | POA: Diagnosis not present

## 2017-06-14 DIAGNOSIS — R0600 Dyspnea, unspecified: Secondary | ICD-10-CM | POA: Diagnosis not present

## 2017-06-14 DIAGNOSIS — I700 Atherosclerosis of aorta: Secondary | ICD-10-CM | POA: Insufficient documentation

## 2017-06-14 DIAGNOSIS — D801 Nonfamilial hypogammaglobulinemia: Secondary | ICD-10-CM | POA: Diagnosis not present

## 2017-06-14 DIAGNOSIS — I2510 Atherosclerotic heart disease of native coronary artery without angina pectoris: Secondary | ICD-10-CM | POA: Diagnosis not present

## 2017-06-14 DIAGNOSIS — I517 Cardiomegaly: Secondary | ICD-10-CM | POA: Insufficient documentation

## 2017-06-14 DIAGNOSIS — R21 Rash and other nonspecific skin eruption: Secondary | ICD-10-CM | POA: Diagnosis not present

## 2017-06-14 DIAGNOSIS — R079 Chest pain, unspecified: Secondary | ICD-10-CM | POA: Diagnosis not present

## 2017-06-14 DIAGNOSIS — D72819 Decreased white blood cell count, unspecified: Secondary | ICD-10-CM | POA: Diagnosis not present

## 2017-06-14 DIAGNOSIS — J9811 Atelectasis: Secondary | ICD-10-CM | POA: Diagnosis not present

## 2017-06-14 DIAGNOSIS — L299 Pruritus, unspecified: Secondary | ICD-10-CM | POA: Diagnosis not present

## 2017-06-14 DIAGNOSIS — Z8579 Personal history of other malignant neoplasms of lymphoid, hematopoietic and related tissues: Secondary | ICD-10-CM | POA: Diagnosis not present

## 2017-06-14 DIAGNOSIS — L309 Dermatitis, unspecified: Secondary | ICD-10-CM | POA: Diagnosis not present

## 2017-06-14 DIAGNOSIS — Z8572 Personal history of non-Hodgkin lymphomas: Secondary | ICD-10-CM | POA: Diagnosis not present

## 2017-06-14 DIAGNOSIS — D61818 Other pancytopenia: Secondary | ICD-10-CM | POA: Diagnosis not present

## 2017-06-14 LAB — POCT I-STAT CREATININE: Creatinine, Ser: 1.4 mg/dL — ABNORMAL HIGH (ref 0.61–1.24)

## 2017-06-14 MED ORDER — NITROGLYCERIN 0.4 MG SL SUBL
SUBLINGUAL_TABLET | SUBLINGUAL | Status: AC
  Administered 2017-06-14: 0.4 mg via SUBLINGUAL
  Filled 2017-06-14: qty 1

## 2017-06-14 MED ORDER — NITROGLYCERIN 0.4 MG SL SUBL
0.4000 mg | SUBLINGUAL_TABLET | Freq: Once | SUBLINGUAL | Status: AC
  Administered 2017-06-14: 0.4 mg via SUBLINGUAL
  Filled 2017-06-14: qty 25

## 2017-06-14 MED ORDER — IOPAMIDOL (ISOVUE-370) INJECTION 76%
INTRAVENOUS | Status: AC
  Filled 2017-06-14: qty 100

## 2017-06-14 NOTE — Progress Notes (Signed)
CT scan completed. Tolerated well. D/C home walking, awake and alert. In no distress. 

## 2017-06-18 ENCOUNTER — Encounter (HOSPITAL_COMMUNITY): Payer: Self-pay

## 2017-06-18 ENCOUNTER — Ambulatory Visit (HOSPITAL_COMMUNITY)
Admission: RE | Admit: 2017-06-18 | Discharge: 2017-06-18 | Disposition: A | Discharge status: Home / Self Care | Payer: Medicare Other | Source: Ambulatory Visit | Attending: Cardiology | Admitting: Cardiology

## 2017-06-18 ENCOUNTER — Other Ambulatory Visit: Payer: Self-pay | Admitting: Cardiology

## 2017-06-18 DIAGNOSIS — I2510 Atherosclerotic heart disease of native coronary artery without angina pectoris: Secondary | ICD-10-CM

## 2017-06-18 DIAGNOSIS — R0600 Dyspnea, unspecified: Secondary | ICD-10-CM | POA: Diagnosis not present

## 2017-06-18 DIAGNOSIS — R079 Chest pain, unspecified: Secondary | ICD-10-CM | POA: Diagnosis not present

## 2017-06-20 DIAGNOSIS — J324 Chronic pansinusitis: Secondary | ICD-10-CM | POA: Diagnosis not present

## 2017-06-20 DIAGNOSIS — Z7951 Long term (current) use of inhaled steroids: Secondary | ICD-10-CM | POA: Diagnosis not present

## 2017-06-20 DIAGNOSIS — H903 Sensorineural hearing loss, bilateral: Secondary | ICD-10-CM | POA: Diagnosis not present

## 2017-06-20 DIAGNOSIS — D469 Myelodysplastic syndrome, unspecified: Secondary | ICD-10-CM | POA: Diagnosis not present

## 2017-06-20 DIAGNOSIS — Z79899 Other long term (current) drug therapy: Secondary | ICD-10-CM | POA: Diagnosis not present

## 2017-06-20 DIAGNOSIS — Z792 Long term (current) use of antibiotics: Secondary | ICD-10-CM | POA: Diagnosis not present

## 2017-06-20 DIAGNOSIS — R490 Dysphonia: Secondary | ICD-10-CM | POA: Diagnosis not present

## 2017-06-20 DIAGNOSIS — H9193 Unspecified hearing loss, bilateral: Secondary | ICD-10-CM | POA: Diagnosis not present

## 2017-06-20 DIAGNOSIS — Z9889 Other specified postprocedural states: Secondary | ICD-10-CM | POA: Diagnosis not present

## 2017-06-20 DIAGNOSIS — R0982 Postnasal drip: Secondary | ICD-10-CM | POA: Diagnosis not present

## 2017-06-20 DIAGNOSIS — J339 Nasal polyp, unspecified: Secondary | ICD-10-CM | POA: Diagnosis not present

## 2017-06-21 DIAGNOSIS — Z950 Presence of cardiac pacemaker: Secondary | ICD-10-CM | POA: Diagnosis not present

## 2017-06-21 DIAGNOSIS — Z5111 Encounter for antineoplastic chemotherapy: Secondary | ICD-10-CM | POA: Diagnosis not present

## 2017-06-21 DIAGNOSIS — I480 Paroxysmal atrial fibrillation: Secondary | ICD-10-CM | POA: Diagnosis not present

## 2017-06-21 DIAGNOSIS — D801 Nonfamilial hypogammaglobulinemia: Secondary | ICD-10-CM | POA: Diagnosis not present

## 2017-06-21 DIAGNOSIS — D469 Myelodysplastic syndrome, unspecified: Secondary | ICD-10-CM | POA: Diagnosis not present

## 2017-06-21 DIAGNOSIS — Z7901 Long term (current) use of anticoagulants: Secondary | ICD-10-CM | POA: Diagnosis not present

## 2017-06-21 DIAGNOSIS — Z8572 Personal history of non-Hodgkin lymphomas: Secondary | ICD-10-CM | POA: Diagnosis not present

## 2017-06-22 DIAGNOSIS — D469 Myelodysplastic syndrome, unspecified: Secondary | ICD-10-CM | POA: Diagnosis not present

## 2017-06-22 DIAGNOSIS — Z79899 Other long term (current) drug therapy: Secondary | ICD-10-CM | POA: Diagnosis not present

## 2017-06-23 DIAGNOSIS — D469 Myelodysplastic syndrome, unspecified: Secondary | ICD-10-CM | POA: Diagnosis not present

## 2017-06-23 DIAGNOSIS — Z79899 Other long term (current) drug therapy: Secondary | ICD-10-CM | POA: Diagnosis not present

## 2017-06-24 DIAGNOSIS — Z5111 Encounter for antineoplastic chemotherapy: Secondary | ICD-10-CM | POA: Diagnosis not present

## 2017-06-24 DIAGNOSIS — C884 Extranodal marginal zone B-cell lymphoma of mucosa-associated lymphoid tissue [MALT-lymphoma]: Secondary | ICD-10-CM | POA: Diagnosis not present

## 2017-06-24 DIAGNOSIS — D469 Myelodysplastic syndrome, unspecified: Secondary | ICD-10-CM | POA: Diagnosis not present

## 2017-06-25 DIAGNOSIS — R110 Nausea: Secondary | ICD-10-CM | POA: Diagnosis not present

## 2017-06-25 DIAGNOSIS — D709 Neutropenia, unspecified: Secondary | ICD-10-CM | POA: Diagnosis not present

## 2017-06-25 DIAGNOSIS — K5903 Drug induced constipation: Secondary | ICD-10-CM | POA: Diagnosis not present

## 2017-06-25 DIAGNOSIS — D469 Myelodysplastic syndrome, unspecified: Secondary | ICD-10-CM | POA: Diagnosis not present

## 2017-06-25 DIAGNOSIS — Z7901 Long term (current) use of anticoagulants: Secondary | ICD-10-CM | POA: Diagnosis not present

## 2017-06-25 DIAGNOSIS — L308 Other specified dermatitis: Secondary | ICD-10-CM | POA: Diagnosis not present

## 2017-06-25 DIAGNOSIS — Z79899 Other long term (current) drug therapy: Secondary | ICD-10-CM | POA: Diagnosis not present

## 2017-06-25 DIAGNOSIS — T451X5A Adverse effect of antineoplastic and immunosuppressive drugs, initial encounter: Secondary | ICD-10-CM | POA: Diagnosis not present

## 2017-06-25 DIAGNOSIS — R05 Cough: Secondary | ICD-10-CM | POA: Diagnosis not present

## 2017-06-26 DIAGNOSIS — R0600 Dyspnea, unspecified: Secondary | ICD-10-CM | POA: Diagnosis not present

## 2017-06-26 DIAGNOSIS — Z7901 Long term (current) use of anticoagulants: Secondary | ICD-10-CM | POA: Diagnosis not present

## 2017-06-26 DIAGNOSIS — R0602 Shortness of breath: Secondary | ICD-10-CM | POA: Diagnosis not present

## 2017-06-26 DIAGNOSIS — I359 Nonrheumatic aortic valve disorder, unspecified: Secondary | ICD-10-CM | POA: Diagnosis not present

## 2017-06-26 DIAGNOSIS — I2510 Atherosclerotic heart disease of native coronary artery without angina pectoris: Secondary | ICD-10-CM | POA: Diagnosis not present

## 2017-06-26 DIAGNOSIS — D469 Myelodysplastic syndrome, unspecified: Secondary | ICD-10-CM | POA: Diagnosis not present

## 2017-06-26 DIAGNOSIS — R0609 Other forms of dyspnea: Secondary | ICD-10-CM | POA: Diagnosis not present

## 2017-06-26 DIAGNOSIS — I480 Paroxysmal atrial fibrillation: Secondary | ICD-10-CM | POA: Diagnosis not present

## 2017-06-26 DIAGNOSIS — I495 Sick sinus syndrome: Secondary | ICD-10-CM | POA: Diagnosis not present

## 2017-06-26 DIAGNOSIS — Z950 Presence of cardiac pacemaker: Secondary | ICD-10-CM | POA: Diagnosis not present

## 2017-06-27 DIAGNOSIS — D801 Nonfamilial hypogammaglobulinemia: Secondary | ICD-10-CM | POA: Diagnosis not present

## 2017-06-27 DIAGNOSIS — D469 Myelodysplastic syndrome, unspecified: Secondary | ICD-10-CM | POA: Diagnosis not present

## 2017-06-28 DIAGNOSIS — Z79899 Other long term (current) drug therapy: Secondary | ICD-10-CM | POA: Diagnosis not present

## 2017-06-28 DIAGNOSIS — Z452 Encounter for adjustment and management of vascular access device: Secondary | ICD-10-CM | POA: Diagnosis not present

## 2017-06-28 DIAGNOSIS — D469 Myelodysplastic syndrome, unspecified: Secondary | ICD-10-CM | POA: Diagnosis not present

## 2017-07-12 DIAGNOSIS — I495 Sick sinus syndrome: Secondary | ICD-10-CM | POA: Diagnosis not present

## 2017-07-12 DIAGNOSIS — R110 Nausea: Secondary | ICD-10-CM | POA: Diagnosis not present

## 2017-07-12 DIAGNOSIS — R5383 Other fatigue: Secondary | ICD-10-CM | POA: Diagnosis not present

## 2017-07-12 DIAGNOSIS — L308 Other specified dermatitis: Secondary | ICD-10-CM | POA: Diagnosis not present

## 2017-07-12 DIAGNOSIS — Z79899 Other long term (current) drug therapy: Secondary | ICD-10-CM | POA: Diagnosis not present

## 2017-07-12 DIAGNOSIS — Z7901 Long term (current) use of anticoagulants: Secondary | ICD-10-CM | POA: Diagnosis not present

## 2017-07-12 DIAGNOSIS — D801 Nonfamilial hypogammaglobulinemia: Secondary | ICD-10-CM | POA: Diagnosis not present

## 2017-07-12 DIAGNOSIS — D469 Myelodysplastic syndrome, unspecified: Secondary | ICD-10-CM | POA: Diagnosis not present

## 2017-07-12 DIAGNOSIS — T451X5A Adverse effect of antineoplastic and immunosuppressive drugs, initial encounter: Secondary | ICD-10-CM | POA: Diagnosis not present

## 2017-07-12 DIAGNOSIS — D709 Neutropenia, unspecified: Secondary | ICD-10-CM | POA: Diagnosis not present

## 2017-07-12 DIAGNOSIS — R05 Cough: Secondary | ICD-10-CM | POA: Diagnosis not present

## 2017-07-12 DIAGNOSIS — K5900 Constipation, unspecified: Secondary | ICD-10-CM | POA: Diagnosis not present

## 2017-07-12 DIAGNOSIS — I480 Paroxysmal atrial fibrillation: Secondary | ICD-10-CM | POA: Diagnosis not present

## 2017-07-12 DIAGNOSIS — I4891 Unspecified atrial fibrillation: Secondary | ICD-10-CM | POA: Diagnosis not present

## 2017-07-12 DIAGNOSIS — Z8572 Personal history of non-Hodgkin lymphomas: Secondary | ICD-10-CM | POA: Diagnosis not present

## 2017-07-12 DIAGNOSIS — Z950 Presence of cardiac pacemaker: Secondary | ICD-10-CM | POA: Diagnosis not present

## 2017-07-17 DIAGNOSIS — Z7901 Long term (current) use of anticoagulants: Secondary | ICD-10-CM | POA: Diagnosis not present

## 2017-07-17 DIAGNOSIS — I2510 Atherosclerotic heart disease of native coronary artery without angina pectoris: Secondary | ICD-10-CM | POA: Diagnosis not present

## 2017-07-17 DIAGNOSIS — I480 Paroxysmal atrial fibrillation: Secondary | ICD-10-CM | POA: Diagnosis not present

## 2017-07-17 DIAGNOSIS — Z950 Presence of cardiac pacemaker: Secondary | ICD-10-CM | POA: Diagnosis not present

## 2017-07-17 DIAGNOSIS — I495 Sick sinus syndrome: Secondary | ICD-10-CM | POA: Diagnosis not present

## 2017-07-17 DIAGNOSIS — R0609 Other forms of dyspnea: Secondary | ICD-10-CM | POA: Diagnosis not present

## 2017-07-17 DIAGNOSIS — R0602 Shortness of breath: Secondary | ICD-10-CM | POA: Diagnosis not present

## 2017-07-17 DIAGNOSIS — I359 Nonrheumatic aortic valve disorder, unspecified: Secondary | ICD-10-CM | POA: Diagnosis not present

## 2017-07-17 DIAGNOSIS — R0600 Dyspnea, unspecified: Secondary | ICD-10-CM | POA: Diagnosis not present

## 2017-07-19 DIAGNOSIS — D469 Myelodysplastic syndrome, unspecified: Secondary | ICD-10-CM | POA: Diagnosis not present

## 2017-07-19 DIAGNOSIS — Z5111 Encounter for antineoplastic chemotherapy: Secondary | ICD-10-CM | POA: Diagnosis not present

## 2017-07-19 DIAGNOSIS — I480 Paroxysmal atrial fibrillation: Secondary | ICD-10-CM | POA: Diagnosis not present

## 2017-07-19 DIAGNOSIS — Z7901 Long term (current) use of anticoagulants: Secondary | ICD-10-CM | POA: Diagnosis not present

## 2017-07-19 DIAGNOSIS — Z950 Presence of cardiac pacemaker: Secondary | ICD-10-CM | POA: Diagnosis not present

## 2017-07-19 DIAGNOSIS — Z85820 Personal history of malignant melanoma of skin: Secondary | ICD-10-CM | POA: Diagnosis not present

## 2017-07-20 DIAGNOSIS — D469 Myelodysplastic syndrome, unspecified: Secondary | ICD-10-CM | POA: Diagnosis not present

## 2017-07-20 DIAGNOSIS — Z5111 Encounter for antineoplastic chemotherapy: Secondary | ICD-10-CM | POA: Diagnosis not present

## 2017-07-21 DIAGNOSIS — D469 Myelodysplastic syndrome, unspecified: Secondary | ICD-10-CM | POA: Diagnosis not present

## 2017-07-21 DIAGNOSIS — Z5111 Encounter for antineoplastic chemotherapy: Secondary | ICD-10-CM | POA: Diagnosis not present

## 2017-07-22 DIAGNOSIS — D469 Myelodysplastic syndrome, unspecified: Secondary | ICD-10-CM | POA: Diagnosis not present

## 2017-07-22 DIAGNOSIS — Z5111 Encounter for antineoplastic chemotherapy: Secondary | ICD-10-CM | POA: Diagnosis not present

## 2017-07-23 DIAGNOSIS — T451X5D Adverse effect of antineoplastic and immunosuppressive drugs, subsequent encounter: Secondary | ICD-10-CM | POA: Diagnosis not present

## 2017-07-23 DIAGNOSIS — Z959 Presence of cardiac and vascular implant and graft, unspecified: Secondary | ICD-10-CM | POA: Diagnosis not present

## 2017-07-23 DIAGNOSIS — I480 Paroxysmal atrial fibrillation: Secondary | ICD-10-CM | POA: Diagnosis not present

## 2017-07-23 DIAGNOSIS — D469 Myelodysplastic syndrome, unspecified: Secondary | ICD-10-CM | POA: Diagnosis not present

## 2017-07-23 DIAGNOSIS — K5903 Drug induced constipation: Secondary | ICD-10-CM | POA: Diagnosis not present

## 2017-07-23 DIAGNOSIS — Z8579 Personal history of other malignant neoplasms of lymphoid, hematopoietic and related tissues: Secondary | ICD-10-CM | POA: Diagnosis not present

## 2017-07-23 DIAGNOSIS — Z8679 Personal history of other diseases of the circulatory system: Secondary | ICD-10-CM | POA: Diagnosis not present

## 2017-07-23 DIAGNOSIS — Z950 Presence of cardiac pacemaker: Secondary | ICD-10-CM | POA: Diagnosis not present

## 2017-07-23 DIAGNOSIS — D801 Nonfamilial hypogammaglobulinemia: Secondary | ICD-10-CM | POA: Diagnosis not present

## 2017-07-23 DIAGNOSIS — D709 Neutropenia, unspecified: Secondary | ICD-10-CM | POA: Diagnosis not present

## 2017-07-23 DIAGNOSIS — R05 Cough: Secondary | ICD-10-CM | POA: Diagnosis not present

## 2017-07-23 DIAGNOSIS — L308 Other specified dermatitis: Secondary | ICD-10-CM | POA: Diagnosis not present

## 2017-07-23 DIAGNOSIS — R112 Nausea with vomiting, unspecified: Secondary | ICD-10-CM | POA: Diagnosis not present

## 2017-07-24 DIAGNOSIS — D469 Myelodysplastic syndrome, unspecified: Secondary | ICD-10-CM | POA: Diagnosis not present

## 2017-07-25 DIAGNOSIS — L2089 Other atopic dermatitis: Secondary | ICD-10-CM | POA: Diagnosis not present

## 2017-07-25 DIAGNOSIS — D801 Nonfamilial hypogammaglobulinemia: Secondary | ICD-10-CM | POA: Diagnosis not present

## 2017-08-09 DIAGNOSIS — D72819 Decreased white blood cell count, unspecified: Secondary | ICD-10-CM | POA: Diagnosis not present

## 2017-08-09 DIAGNOSIS — D801 Nonfamilial hypogammaglobulinemia: Secondary | ICD-10-CM | POA: Diagnosis not present

## 2017-08-09 DIAGNOSIS — D469 Myelodysplastic syndrome, unspecified: Secondary | ICD-10-CM | POA: Diagnosis not present

## 2017-08-16 DIAGNOSIS — D469 Myelodysplastic syndrome, unspecified: Secondary | ICD-10-CM | POA: Diagnosis not present

## 2017-08-16 DIAGNOSIS — Z5111 Encounter for antineoplastic chemotherapy: Secondary | ICD-10-CM | POA: Diagnosis not present

## 2017-08-17 DIAGNOSIS — D469 Myelodysplastic syndrome, unspecified: Secondary | ICD-10-CM | POA: Diagnosis not present

## 2017-08-17 DIAGNOSIS — Z5111 Encounter for antineoplastic chemotherapy: Secondary | ICD-10-CM | POA: Diagnosis not present

## 2017-08-18 DIAGNOSIS — D469 Myelodysplastic syndrome, unspecified: Secondary | ICD-10-CM | POA: Diagnosis not present

## 2017-08-19 DIAGNOSIS — Z5111 Encounter for antineoplastic chemotherapy: Secondary | ICD-10-CM | POA: Diagnosis not present

## 2017-08-19 DIAGNOSIS — D469 Myelodysplastic syndrome, unspecified: Secondary | ICD-10-CM | POA: Diagnosis not present

## 2017-08-19 DIAGNOSIS — C884 Extranodal marginal zone B-cell lymphoma of mucosa-associated lymphoid tissue [MALT-lymphoma]: Secondary | ICD-10-CM | POA: Diagnosis not present

## 2017-08-20 DIAGNOSIS — Z5111 Encounter for antineoplastic chemotherapy: Secondary | ICD-10-CM | POA: Diagnosis not present

## 2017-08-20 DIAGNOSIS — D469 Myelodysplastic syndrome, unspecified: Secondary | ICD-10-CM | POA: Diagnosis not present

## 2017-08-21 DIAGNOSIS — D469 Myelodysplastic syndrome, unspecified: Secondary | ICD-10-CM | POA: Diagnosis not present

## 2017-08-21 DIAGNOSIS — Z5111 Encounter for antineoplastic chemotherapy: Secondary | ICD-10-CM | POA: Diagnosis not present

## 2017-08-22 DIAGNOSIS — Z5111 Encounter for antineoplastic chemotherapy: Secondary | ICD-10-CM | POA: Diagnosis not present

## 2017-08-22 DIAGNOSIS — D801 Nonfamilial hypogammaglobulinemia: Secondary | ICD-10-CM | POA: Diagnosis not present

## 2017-08-22 DIAGNOSIS — D469 Myelodysplastic syndrome, unspecified: Secondary | ICD-10-CM | POA: Diagnosis not present

## 2017-09-13 DIAGNOSIS — D469 Myelodysplastic syndrome, unspecified: Secondary | ICD-10-CM | POA: Diagnosis not present

## 2017-09-13 DIAGNOSIS — R0602 Shortness of breath: Secondary | ICD-10-CM | POA: Diagnosis not present

## 2017-09-13 DIAGNOSIS — Z8679 Personal history of other diseases of the circulatory system: Secondary | ICD-10-CM | POA: Diagnosis not present

## 2017-09-13 DIAGNOSIS — Z950 Presence of cardiac pacemaker: Secondary | ICD-10-CM | POA: Diagnosis not present

## 2017-09-13 DIAGNOSIS — Z5111 Encounter for antineoplastic chemotherapy: Secondary | ICD-10-CM | POA: Diagnosis not present

## 2017-09-13 DIAGNOSIS — D801 Nonfamilial hypogammaglobulinemia: Secondary | ICD-10-CM | POA: Diagnosis not present

## 2017-09-13 DIAGNOSIS — Z7901 Long term (current) use of anticoagulants: Secondary | ICD-10-CM | POA: Diagnosis not present

## 2017-09-14 DIAGNOSIS — Z5111 Encounter for antineoplastic chemotherapy: Secondary | ICD-10-CM | POA: Diagnosis not present

## 2017-09-14 DIAGNOSIS — D469 Myelodysplastic syndrome, unspecified: Secondary | ICD-10-CM | POA: Diagnosis not present

## 2017-09-15 DIAGNOSIS — D469 Myelodysplastic syndrome, unspecified: Secondary | ICD-10-CM | POA: Diagnosis not present

## 2017-09-15 DIAGNOSIS — Z5111 Encounter for antineoplastic chemotherapy: Secondary | ICD-10-CM | POA: Diagnosis not present

## 2017-09-16 DIAGNOSIS — Z5111 Encounter for antineoplastic chemotherapy: Secondary | ICD-10-CM | POA: Diagnosis not present

## 2017-09-16 DIAGNOSIS — D469 Myelodysplastic syndrome, unspecified: Secondary | ICD-10-CM | POA: Diagnosis not present

## 2017-09-17 DIAGNOSIS — Z5111 Encounter for antineoplastic chemotherapy: Secondary | ICD-10-CM | POA: Diagnosis not present

## 2017-09-17 DIAGNOSIS — D469 Myelodysplastic syndrome, unspecified: Secondary | ICD-10-CM | POA: Diagnosis not present

## 2017-09-18 DIAGNOSIS — D469 Myelodysplastic syndrome, unspecified: Secondary | ICD-10-CM | POA: Diagnosis not present

## 2017-09-18 DIAGNOSIS — Z5111 Encounter for antineoplastic chemotherapy: Secondary | ICD-10-CM | POA: Diagnosis not present

## 2017-09-19 DIAGNOSIS — D469 Myelodysplastic syndrome, unspecified: Secondary | ICD-10-CM | POA: Diagnosis not present

## 2017-09-19 DIAGNOSIS — Z5111 Encounter for antineoplastic chemotherapy: Secondary | ICD-10-CM | POA: Diagnosis not present

## 2017-09-19 DIAGNOSIS — D801 Nonfamilial hypogammaglobulinemia: Secondary | ICD-10-CM | POA: Diagnosis not present

## 2017-10-04 DIAGNOSIS — K209 Esophagitis, unspecified: Secondary | ICD-10-CM | POA: Diagnosis not present

## 2017-10-04 DIAGNOSIS — D72819 Decreased white blood cell count, unspecified: Secondary | ICD-10-CM | POA: Diagnosis not present

## 2017-10-04 DIAGNOSIS — D709 Neutropenia, unspecified: Secondary | ICD-10-CM | POA: Diagnosis not present

## 2017-10-04 DIAGNOSIS — Z950 Presence of cardiac pacemaker: Secondary | ICD-10-CM | POA: Diagnosis not present

## 2017-10-04 DIAGNOSIS — D801 Nonfamilial hypogammaglobulinemia: Secondary | ICD-10-CM | POA: Diagnosis not present

## 2017-10-04 DIAGNOSIS — J329 Chronic sinusitis, unspecified: Secondary | ICD-10-CM | POA: Diagnosis not present

## 2017-10-04 DIAGNOSIS — K5900 Constipation, unspecified: Secondary | ICD-10-CM | POA: Diagnosis not present

## 2017-10-04 DIAGNOSIS — R5383 Other fatigue: Secondary | ICD-10-CM | POA: Diagnosis not present

## 2017-10-04 DIAGNOSIS — R1111 Vomiting without nausea: Secondary | ICD-10-CM | POA: Diagnosis not present

## 2017-10-04 DIAGNOSIS — D469 Myelodysplastic syndrome, unspecified: Secondary | ICD-10-CM | POA: Diagnosis not present

## 2017-10-04 DIAGNOSIS — Z8572 Personal history of non-Hodgkin lymphomas: Secondary | ICD-10-CM | POA: Diagnosis not present

## 2017-10-04 DIAGNOSIS — T451X5A Adverse effect of antineoplastic and immunosuppressive drugs, initial encounter: Secondary | ICD-10-CM | POA: Diagnosis not present

## 2017-10-10 DIAGNOSIS — Z7901 Long term (current) use of anticoagulants: Secondary | ICD-10-CM | POA: Diagnosis not present

## 2017-10-10 DIAGNOSIS — I495 Sick sinus syndrome: Secondary | ICD-10-CM | POA: Diagnosis not present

## 2017-10-10 DIAGNOSIS — R0609 Other forms of dyspnea: Secondary | ICD-10-CM | POA: Diagnosis not present

## 2017-10-10 DIAGNOSIS — R0600 Dyspnea, unspecified: Secondary | ICD-10-CM | POA: Diagnosis not present

## 2017-10-10 DIAGNOSIS — Z950 Presence of cardiac pacemaker: Secondary | ICD-10-CM | POA: Diagnosis not present

## 2017-10-10 DIAGNOSIS — I2510 Atherosclerotic heart disease of native coronary artery without angina pectoris: Secondary | ICD-10-CM | POA: Diagnosis not present

## 2017-10-10 DIAGNOSIS — R0602 Shortness of breath: Secondary | ICD-10-CM | POA: Diagnosis not present

## 2017-10-10 DIAGNOSIS — I359 Nonrheumatic aortic valve disorder, unspecified: Secondary | ICD-10-CM | POA: Diagnosis not present

## 2017-10-10 DIAGNOSIS — I480 Paroxysmal atrial fibrillation: Secondary | ICD-10-CM | POA: Diagnosis not present

## 2017-10-11 DIAGNOSIS — D469 Myelodysplastic syndrome, unspecified: Secondary | ICD-10-CM | POA: Diagnosis not present

## 2017-10-11 DIAGNOSIS — Z5111 Encounter for antineoplastic chemotherapy: Secondary | ICD-10-CM | POA: Diagnosis not present

## 2017-10-12 DIAGNOSIS — Z5111 Encounter for antineoplastic chemotherapy: Secondary | ICD-10-CM | POA: Diagnosis not present

## 2017-10-12 DIAGNOSIS — D469 Myelodysplastic syndrome, unspecified: Secondary | ICD-10-CM | POA: Diagnosis not present

## 2017-10-13 DIAGNOSIS — D469 Myelodysplastic syndrome, unspecified: Secondary | ICD-10-CM | POA: Diagnosis not present

## 2017-10-13 DIAGNOSIS — Z5111 Encounter for antineoplastic chemotherapy: Secondary | ICD-10-CM | POA: Diagnosis not present

## 2017-10-14 DIAGNOSIS — L309 Dermatitis, unspecified: Secondary | ICD-10-CM | POA: Diagnosis not present

## 2017-10-14 DIAGNOSIS — L821 Other seborrheic keratosis: Secondary | ICD-10-CM | POA: Diagnosis not present

## 2017-10-14 DIAGNOSIS — Z5111 Encounter for antineoplastic chemotherapy: Secondary | ICD-10-CM | POA: Diagnosis not present

## 2017-10-14 DIAGNOSIS — D225 Melanocytic nevi of trunk: Secondary | ICD-10-CM | POA: Diagnosis not present

## 2017-10-14 DIAGNOSIS — C44722 Squamous cell carcinoma of skin of right lower limb, including hip: Secondary | ICD-10-CM | POA: Diagnosis not present

## 2017-10-14 DIAGNOSIS — C44622 Squamous cell carcinoma of skin of right upper limb, including shoulder: Secondary | ICD-10-CM | POA: Diagnosis not present

## 2017-10-14 DIAGNOSIS — D469 Myelodysplastic syndrome, unspecified: Secondary | ICD-10-CM | POA: Diagnosis not present

## 2017-10-14 DIAGNOSIS — D485 Neoplasm of uncertain behavior of skin: Secondary | ICD-10-CM | POA: Diagnosis not present

## 2017-10-14 DIAGNOSIS — L570 Actinic keratosis: Secondary | ICD-10-CM | POA: Diagnosis not present

## 2017-10-15 DIAGNOSIS — D801 Nonfamilial hypogammaglobulinemia: Secondary | ICD-10-CM | POA: Diagnosis not present

## 2017-10-15 DIAGNOSIS — D469 Myelodysplastic syndrome, unspecified: Secondary | ICD-10-CM | POA: Diagnosis not present

## 2017-10-16 DIAGNOSIS — D469 Myelodysplastic syndrome, unspecified: Secondary | ICD-10-CM | POA: Diagnosis not present

## 2017-10-16 DIAGNOSIS — Z5111 Encounter for antineoplastic chemotherapy: Secondary | ICD-10-CM | POA: Diagnosis not present

## 2017-10-17 DIAGNOSIS — R490 Dysphonia: Secondary | ICD-10-CM | POA: Diagnosis not present

## 2017-10-17 DIAGNOSIS — J324 Chronic pansinusitis: Secondary | ICD-10-CM | POA: Diagnosis not present

## 2017-10-17 DIAGNOSIS — Z974 Presence of external hearing-aid: Secondary | ICD-10-CM | POA: Diagnosis not present

## 2017-10-17 DIAGNOSIS — J339 Nasal polyp, unspecified: Secondary | ICD-10-CM | POA: Diagnosis not present

## 2017-10-17 DIAGNOSIS — R0982 Postnasal drip: Secondary | ICD-10-CM | POA: Diagnosis not present

## 2017-10-17 DIAGNOSIS — D469 Myelodysplastic syndrome, unspecified: Secondary | ICD-10-CM | POA: Diagnosis not present

## 2017-10-17 DIAGNOSIS — H918X3 Other specified hearing loss, bilateral: Secondary | ICD-10-CM | POA: Diagnosis not present

## 2017-11-08 DIAGNOSIS — K5903 Drug induced constipation: Secondary | ICD-10-CM | POA: Diagnosis not present

## 2017-11-08 DIAGNOSIS — G4700 Insomnia, unspecified: Secondary | ICD-10-CM | POA: Diagnosis not present

## 2017-11-08 DIAGNOSIS — D469 Myelodysplastic syndrome, unspecified: Secondary | ICD-10-CM | POA: Diagnosis not present

## 2017-11-08 DIAGNOSIS — D801 Nonfamilial hypogammaglobulinemia: Secondary | ICD-10-CM | POA: Diagnosis not present

## 2017-11-09 DIAGNOSIS — D469 Myelodysplastic syndrome, unspecified: Secondary | ICD-10-CM | POA: Diagnosis not present

## 2017-11-09 DIAGNOSIS — Z5111 Encounter for antineoplastic chemotherapy: Secondary | ICD-10-CM | POA: Diagnosis not present

## 2017-11-10 DIAGNOSIS — D469 Myelodysplastic syndrome, unspecified: Secondary | ICD-10-CM | POA: Diagnosis not present

## 2017-11-11 DIAGNOSIS — Z5111 Encounter for antineoplastic chemotherapy: Secondary | ICD-10-CM | POA: Diagnosis not present

## 2017-11-11 DIAGNOSIS — D469 Myelodysplastic syndrome, unspecified: Secondary | ICD-10-CM | POA: Diagnosis not present

## 2017-11-12 DIAGNOSIS — D801 Nonfamilial hypogammaglobulinemia: Secondary | ICD-10-CM | POA: Diagnosis not present

## 2017-11-12 DIAGNOSIS — Z5111 Encounter for antineoplastic chemotherapy: Secondary | ICD-10-CM | POA: Diagnosis not present

## 2017-11-12 DIAGNOSIS — D469 Myelodysplastic syndrome, unspecified: Secondary | ICD-10-CM | POA: Diagnosis not present

## 2017-11-13 DIAGNOSIS — D469 Myelodysplastic syndrome, unspecified: Secondary | ICD-10-CM | POA: Diagnosis not present

## 2017-11-13 DIAGNOSIS — Z5111 Encounter for antineoplastic chemotherapy: Secondary | ICD-10-CM | POA: Diagnosis not present

## 2017-11-14 DIAGNOSIS — D801 Nonfamilial hypogammaglobulinemia: Secondary | ICD-10-CM | POA: Diagnosis not present

## 2017-11-14 DIAGNOSIS — T733XXA Exhaustion due to excessive exertion, initial encounter: Secondary | ICD-10-CM | POA: Diagnosis not present

## 2017-11-14 DIAGNOSIS — Z5111 Encounter for antineoplastic chemotherapy: Secondary | ICD-10-CM | POA: Diagnosis not present

## 2017-11-14 DIAGNOSIS — D469 Myelodysplastic syndrome, unspecified: Secondary | ICD-10-CM | POA: Diagnosis not present

## 2017-11-18 DIAGNOSIS — L233 Allergic contact dermatitis due to drugs in contact with skin: Secondary | ICD-10-CM | POA: Diagnosis not present

## 2017-11-19 DIAGNOSIS — H52223 Regular astigmatism, bilateral: Secondary | ICD-10-CM | POA: Diagnosis not present

## 2017-11-19 DIAGNOSIS — H25813 Combined forms of age-related cataract, bilateral: Secondary | ICD-10-CM | POA: Diagnosis not present

## 2017-11-19 DIAGNOSIS — H5203 Hypermetropia, bilateral: Secondary | ICD-10-CM | POA: Diagnosis not present

## 2017-11-19 DIAGNOSIS — H3540 Unspecified peripheral retinal degeneration: Secondary | ICD-10-CM | POA: Diagnosis not present

## 2017-11-20 DIAGNOSIS — C44622 Squamous cell carcinoma of skin of right upper limb, including shoulder: Secondary | ICD-10-CM | POA: Diagnosis not present

## 2017-11-20 DIAGNOSIS — C44722 Squamous cell carcinoma of skin of right lower limb, including hip: Secondary | ICD-10-CM | POA: Diagnosis not present

## 2017-11-20 DIAGNOSIS — L905 Scar conditions and fibrosis of skin: Secondary | ICD-10-CM | POA: Diagnosis not present

## 2017-11-22 DIAGNOSIS — L308 Other specified dermatitis: Secondary | ICD-10-CM | POA: Diagnosis not present

## 2017-12-03 DIAGNOSIS — D801 Nonfamilial hypogammaglobulinemia: Secondary | ICD-10-CM | POA: Diagnosis not present

## 2017-12-03 DIAGNOSIS — D469 Myelodysplastic syndrome, unspecified: Secondary | ICD-10-CM | POA: Diagnosis not present

## 2017-12-03 DIAGNOSIS — D4621 Refractory anemia with excess of blasts 1: Secondary | ICD-10-CM | POA: Diagnosis not present

## 2017-12-06 DIAGNOSIS — D469 Myelodysplastic syndrome, unspecified: Secondary | ICD-10-CM | POA: Diagnosis not present

## 2017-12-06 DIAGNOSIS — Z5111 Encounter for antineoplastic chemotherapy: Secondary | ICD-10-CM | POA: Diagnosis not present

## 2017-12-06 DIAGNOSIS — D801 Nonfamilial hypogammaglobulinemia: Secondary | ICD-10-CM | POA: Diagnosis not present

## 2017-12-07 DIAGNOSIS — D469 Myelodysplastic syndrome, unspecified: Secondary | ICD-10-CM | POA: Diagnosis not present

## 2017-12-07 DIAGNOSIS — Z5111 Encounter for antineoplastic chemotherapy: Secondary | ICD-10-CM | POA: Diagnosis not present

## 2017-12-08 DIAGNOSIS — D469 Myelodysplastic syndrome, unspecified: Secondary | ICD-10-CM | POA: Diagnosis not present

## 2017-12-08 DIAGNOSIS — Z5111 Encounter for antineoplastic chemotherapy: Secondary | ICD-10-CM | POA: Diagnosis not present

## 2017-12-09 DIAGNOSIS — D469 Myelodysplastic syndrome, unspecified: Secondary | ICD-10-CM | POA: Diagnosis not present

## 2017-12-09 DIAGNOSIS — Z5111 Encounter for antineoplastic chemotherapy: Secondary | ICD-10-CM | POA: Diagnosis not present

## 2017-12-10 DIAGNOSIS — D469 Myelodysplastic syndrome, unspecified: Secondary | ICD-10-CM | POA: Diagnosis not present

## 2017-12-10 DIAGNOSIS — Z5111 Encounter for antineoplastic chemotherapy: Secondary | ICD-10-CM | POA: Diagnosis not present

## 2017-12-11 DIAGNOSIS — D469 Myelodysplastic syndrome, unspecified: Secondary | ICD-10-CM | POA: Diagnosis not present

## 2017-12-12 DIAGNOSIS — D469 Myelodysplastic syndrome, unspecified: Secondary | ICD-10-CM | POA: Diagnosis not present

## 2018-01-02 DIAGNOSIS — Z79899 Other long term (current) drug therapy: Secondary | ICD-10-CM | POA: Diagnosis not present

## 2018-01-02 DIAGNOSIS — D469 Myelodysplastic syndrome, unspecified: Secondary | ICD-10-CM | POA: Diagnosis not present

## 2018-01-02 DIAGNOSIS — D801 Nonfamilial hypogammaglobulinemia: Secondary | ICD-10-CM | POA: Diagnosis not present

## 2018-01-03 DIAGNOSIS — D801 Nonfamilial hypogammaglobulinemia: Secondary | ICD-10-CM | POA: Diagnosis not present

## 2018-01-03 DIAGNOSIS — Z5112 Encounter for antineoplastic immunotherapy: Secondary | ICD-10-CM | POA: Diagnosis not present

## 2018-01-03 DIAGNOSIS — D469 Myelodysplastic syndrome, unspecified: Secondary | ICD-10-CM | POA: Diagnosis not present

## 2018-01-04 DIAGNOSIS — D469 Myelodysplastic syndrome, unspecified: Secondary | ICD-10-CM | POA: Diagnosis not present

## 2018-01-05 DIAGNOSIS — D469 Myelodysplastic syndrome, unspecified: Secondary | ICD-10-CM | POA: Diagnosis not present

## 2018-01-06 DIAGNOSIS — D469 Myelodysplastic syndrome, unspecified: Secondary | ICD-10-CM | POA: Diagnosis not present

## 2018-01-07 DIAGNOSIS — D469 Myelodysplastic syndrome, unspecified: Secondary | ICD-10-CM | POA: Diagnosis not present

## 2018-01-08 DIAGNOSIS — D469 Myelodysplastic syndrome, unspecified: Secondary | ICD-10-CM | POA: Diagnosis not present

## 2018-01-09 DIAGNOSIS — R0602 Shortness of breath: Secondary | ICD-10-CM | POA: Diagnosis not present

## 2018-01-09 DIAGNOSIS — I2510 Atherosclerotic heart disease of native coronary artery without angina pectoris: Secondary | ICD-10-CM | POA: Diagnosis not present

## 2018-01-09 DIAGNOSIS — R0609 Other forms of dyspnea: Secondary | ICD-10-CM | POA: Diagnosis not present

## 2018-01-09 DIAGNOSIS — I495 Sick sinus syndrome: Secondary | ICD-10-CM | POA: Diagnosis not present

## 2018-01-09 DIAGNOSIS — I359 Nonrheumatic aortic valve disorder, unspecified: Secondary | ICD-10-CM | POA: Diagnosis not present

## 2018-01-09 DIAGNOSIS — Z7901 Long term (current) use of anticoagulants: Secondary | ICD-10-CM | POA: Diagnosis not present

## 2018-01-09 DIAGNOSIS — R0600 Dyspnea, unspecified: Secondary | ICD-10-CM | POA: Diagnosis not present

## 2018-01-09 DIAGNOSIS — I480 Paroxysmal atrial fibrillation: Secondary | ICD-10-CM | POA: Diagnosis not present

## 2018-01-09 DIAGNOSIS — Z950 Presence of cardiac pacemaker: Secondary | ICD-10-CM | POA: Diagnosis not present

## 2018-01-21 DIAGNOSIS — Z950 Presence of cardiac pacemaker: Secondary | ICD-10-CM | POA: Diagnosis not present

## 2018-01-21 DIAGNOSIS — R609 Edema, unspecified: Secondary | ICD-10-CM | POA: Diagnosis not present

## 2018-01-21 DIAGNOSIS — I2510 Atherosclerotic heart disease of native coronary artery without angina pectoris: Secondary | ICD-10-CM | POA: Diagnosis not present

## 2018-01-21 DIAGNOSIS — R0602 Shortness of breath: Secondary | ICD-10-CM | POA: Diagnosis not present

## 2018-01-21 DIAGNOSIS — I359 Nonrheumatic aortic valve disorder, unspecified: Secondary | ICD-10-CM | POA: Diagnosis not present

## 2018-01-21 DIAGNOSIS — I480 Paroxysmal atrial fibrillation: Secondary | ICD-10-CM | POA: Diagnosis not present

## 2018-01-21 DIAGNOSIS — Z7901 Long term (current) use of anticoagulants: Secondary | ICD-10-CM | POA: Diagnosis not present

## 2018-01-21 DIAGNOSIS — I9589 Other hypotension: Secondary | ICD-10-CM | POA: Diagnosis not present

## 2018-01-21 DIAGNOSIS — I495 Sick sinus syndrome: Secondary | ICD-10-CM | POA: Diagnosis not present

## 2018-01-24 DIAGNOSIS — R0609 Other forms of dyspnea: Secondary | ICD-10-CM | POA: Diagnosis not present

## 2018-01-28 DIAGNOSIS — K5903 Drug induced constipation: Secondary | ICD-10-CM | POA: Diagnosis not present

## 2018-01-28 DIAGNOSIS — D709 Neutropenia, unspecified: Secondary | ICD-10-CM | POA: Diagnosis not present

## 2018-01-28 DIAGNOSIS — Z9221 Personal history of antineoplastic chemotherapy: Secondary | ICD-10-CM | POA: Diagnosis not present

## 2018-01-28 DIAGNOSIS — T451X5A Adverse effect of antineoplastic and immunosuppressive drugs, initial encounter: Secondary | ICD-10-CM | POA: Diagnosis not present

## 2018-01-28 DIAGNOSIS — L209 Atopic dermatitis, unspecified: Secondary | ICD-10-CM | POA: Diagnosis not present

## 2018-01-28 DIAGNOSIS — I480 Paroxysmal atrial fibrillation: Secondary | ICD-10-CM | POA: Diagnosis not present

## 2018-01-28 DIAGNOSIS — D469 Myelodysplastic syndrome, unspecified: Secondary | ICD-10-CM | POA: Diagnosis not present

## 2018-01-28 DIAGNOSIS — G4700 Insomnia, unspecified: Secondary | ICD-10-CM | POA: Diagnosis not present

## 2018-01-28 DIAGNOSIS — Z7901 Long term (current) use of anticoagulants: Secondary | ICD-10-CM | POA: Diagnosis not present

## 2018-02-03 DIAGNOSIS — D801 Nonfamilial hypogammaglobulinemia: Secondary | ICD-10-CM | POA: Diagnosis not present

## 2018-02-03 DIAGNOSIS — Z2913 Encounter for prophylactic Rho(D) immune globulin: Secondary | ICD-10-CM | POA: Diagnosis not present

## 2018-02-03 DIAGNOSIS — Z79899 Other long term (current) drug therapy: Secondary | ICD-10-CM | POA: Diagnosis not present

## 2018-02-03 DIAGNOSIS — D469 Myelodysplastic syndrome, unspecified: Secondary | ICD-10-CM | POA: Diagnosis not present

## 2018-02-04 DIAGNOSIS — Z5111 Encounter for antineoplastic chemotherapy: Secondary | ICD-10-CM | POA: Diagnosis not present

## 2018-02-04 DIAGNOSIS — D469 Myelodysplastic syndrome, unspecified: Secondary | ICD-10-CM | POA: Diagnosis not present

## 2018-02-04 DIAGNOSIS — Z79899 Other long term (current) drug therapy: Secondary | ICD-10-CM | POA: Diagnosis not present

## 2018-02-04 DIAGNOSIS — Z950 Presence of cardiac pacemaker: Secondary | ICD-10-CM | POA: Diagnosis not present

## 2018-02-04 DIAGNOSIS — T451X5A Adverse effect of antineoplastic and immunosuppressive drugs, initial encounter: Secondary | ICD-10-CM | POA: Diagnosis not present

## 2018-02-04 DIAGNOSIS — D709 Neutropenia, unspecified: Secondary | ICD-10-CM | POA: Diagnosis not present

## 2018-02-04 DIAGNOSIS — Z7901 Long term (current) use of anticoagulants: Secondary | ICD-10-CM | POA: Diagnosis not present

## 2018-02-04 DIAGNOSIS — K5903 Drug induced constipation: Secondary | ICD-10-CM | POA: Diagnosis not present

## 2018-02-04 DIAGNOSIS — I495 Sick sinus syndrome: Secondary | ICD-10-CM | POA: Diagnosis not present

## 2018-02-04 DIAGNOSIS — G4700 Insomnia, unspecified: Secondary | ICD-10-CM | POA: Diagnosis not present

## 2018-02-05 DIAGNOSIS — Z5111 Encounter for antineoplastic chemotherapy: Secondary | ICD-10-CM | POA: Diagnosis not present

## 2018-02-05 DIAGNOSIS — D469 Myelodysplastic syndrome, unspecified: Secondary | ICD-10-CM | POA: Diagnosis not present

## 2018-02-05 DIAGNOSIS — Z79899 Other long term (current) drug therapy: Secondary | ICD-10-CM | POA: Diagnosis not present

## 2018-02-06 DIAGNOSIS — Z5111 Encounter for antineoplastic chemotherapy: Secondary | ICD-10-CM | POA: Diagnosis not present

## 2018-02-06 DIAGNOSIS — D469 Myelodysplastic syndrome, unspecified: Secondary | ICD-10-CM | POA: Diagnosis not present

## 2018-02-06 DIAGNOSIS — Z79899 Other long term (current) drug therapy: Secondary | ICD-10-CM | POA: Diagnosis not present

## 2018-02-07 DIAGNOSIS — Z79899 Other long term (current) drug therapy: Secondary | ICD-10-CM | POA: Diagnosis not present

## 2018-02-07 DIAGNOSIS — D469 Myelodysplastic syndrome, unspecified: Secondary | ICD-10-CM | POA: Diagnosis not present

## 2018-02-07 DIAGNOSIS — Z5111 Encounter for antineoplastic chemotherapy: Secondary | ICD-10-CM | POA: Diagnosis not present

## 2018-02-18 DIAGNOSIS — L2089 Other atopic dermatitis: Secondary | ICD-10-CM | POA: Diagnosis not present

## 2018-02-18 DIAGNOSIS — D801 Nonfamilial hypogammaglobulinemia: Secondary | ICD-10-CM | POA: Diagnosis not present

## 2018-02-18 DIAGNOSIS — G4700 Insomnia, unspecified: Secondary | ICD-10-CM | POA: Diagnosis not present

## 2018-02-18 DIAGNOSIS — K5903 Drug induced constipation: Secondary | ICD-10-CM | POA: Diagnosis not present

## 2018-02-18 DIAGNOSIS — Z7901 Long term (current) use of anticoagulants: Secondary | ICD-10-CM | POA: Diagnosis not present

## 2018-02-18 DIAGNOSIS — Z950 Presence of cardiac pacemaker: Secondary | ICD-10-CM | POA: Diagnosis not present

## 2018-02-18 DIAGNOSIS — D469 Myelodysplastic syndrome, unspecified: Secondary | ICD-10-CM | POA: Diagnosis not present

## 2018-02-18 DIAGNOSIS — Z9221 Personal history of antineoplastic chemotherapy: Secondary | ICD-10-CM | POA: Diagnosis not present

## 2018-02-18 DIAGNOSIS — I480 Paroxysmal atrial fibrillation: Secondary | ICD-10-CM | POA: Diagnosis not present

## 2018-02-18 DIAGNOSIS — T451X5A Adverse effect of antineoplastic and immunosuppressive drugs, initial encounter: Secondary | ICD-10-CM | POA: Diagnosis not present

## 2018-02-24 DIAGNOSIS — L570 Actinic keratosis: Secondary | ICD-10-CM | POA: Diagnosis not present

## 2018-02-24 DIAGNOSIS — L821 Other seborrheic keratosis: Secondary | ICD-10-CM | POA: Diagnosis not present

## 2018-02-24 DIAGNOSIS — Z85828 Personal history of other malignant neoplasm of skin: Secondary | ICD-10-CM | POA: Diagnosis not present

## 2018-02-24 DIAGNOSIS — R21 Rash and other nonspecific skin eruption: Secondary | ICD-10-CM | POA: Diagnosis not present

## 2018-02-25 DIAGNOSIS — I480 Paroxysmal atrial fibrillation: Secondary | ICD-10-CM | POA: Diagnosis not present

## 2018-02-25 DIAGNOSIS — D801 Nonfamilial hypogammaglobulinemia: Secondary | ICD-10-CM | POA: Diagnosis not present

## 2018-02-25 DIAGNOSIS — Z9221 Personal history of antineoplastic chemotherapy: Secondary | ICD-10-CM | POA: Diagnosis not present

## 2018-02-25 DIAGNOSIS — T451X5A Adverse effect of antineoplastic and immunosuppressive drugs, initial encounter: Secondary | ICD-10-CM | POA: Diagnosis not present

## 2018-02-25 DIAGNOSIS — K5903 Drug induced constipation: Secondary | ICD-10-CM | POA: Diagnosis not present

## 2018-02-25 DIAGNOSIS — R110 Nausea: Secondary | ICD-10-CM | POA: Diagnosis not present

## 2018-02-25 DIAGNOSIS — L2089 Other atopic dermatitis: Secondary | ICD-10-CM | POA: Diagnosis not present

## 2018-02-25 DIAGNOSIS — Z7901 Long term (current) use of anticoagulants: Secondary | ICD-10-CM | POA: Diagnosis not present

## 2018-02-25 DIAGNOSIS — Z85840 Personal history of malignant neoplasm of eye: Secondary | ICD-10-CM | POA: Diagnosis not present

## 2018-02-25 DIAGNOSIS — G4700 Insomnia, unspecified: Secondary | ICD-10-CM | POA: Diagnosis not present

## 2018-02-25 DIAGNOSIS — D469 Myelodysplastic syndrome, unspecified: Secondary | ICD-10-CM | POA: Diagnosis not present

## 2018-02-26 DIAGNOSIS — R7302 Impaired glucose tolerance (oral): Secondary | ICD-10-CM | POA: Diagnosis not present

## 2018-02-26 DIAGNOSIS — R82998 Other abnormal findings in urine: Secondary | ICD-10-CM | POA: Diagnosis not present

## 2018-02-26 DIAGNOSIS — E7849 Other hyperlipidemia: Secondary | ICD-10-CM | POA: Diagnosis not present

## 2018-03-03 DIAGNOSIS — Z2913 Encounter for prophylactic Rho(D) immune globulin: Secondary | ICD-10-CM | POA: Diagnosis not present

## 2018-03-03 DIAGNOSIS — G4709 Other insomnia: Secondary | ICD-10-CM | POA: Diagnosis not present

## 2018-03-03 DIAGNOSIS — Z23 Encounter for immunization: Secondary | ICD-10-CM | POA: Diagnosis not present

## 2018-03-03 DIAGNOSIS — R7302 Impaired glucose tolerance (oral): Secondary | ICD-10-CM | POA: Diagnosis not present

## 2018-03-03 DIAGNOSIS — Z0000 Encounter for general adult medical examination without abnormal findings: Secondary | ICD-10-CM | POA: Diagnosis not present

## 2018-03-03 DIAGNOSIS — G6289 Other specified polyneuropathies: Secondary | ICD-10-CM | POA: Diagnosis not present

## 2018-03-03 DIAGNOSIS — C8590 Non-Hodgkin lymphoma, unspecified, unspecified site: Secondary | ICD-10-CM | POA: Diagnosis not present

## 2018-03-03 DIAGNOSIS — E7849 Other hyperlipidemia: Secondary | ICD-10-CM | POA: Diagnosis not present

## 2018-03-03 DIAGNOSIS — C439 Malignant melanoma of skin, unspecified: Secondary | ICD-10-CM | POA: Diagnosis not present

## 2018-03-03 DIAGNOSIS — Z1389 Encounter for screening for other disorder: Secondary | ICD-10-CM | POA: Diagnosis not present

## 2018-03-03 DIAGNOSIS — D469 Myelodysplastic syndrome, unspecified: Secondary | ICD-10-CM | POA: Diagnosis not present

## 2018-03-03 DIAGNOSIS — Z5111 Encounter for antineoplastic chemotherapy: Secondary | ICD-10-CM | POA: Diagnosis not present

## 2018-03-03 DIAGNOSIS — C884 Extranodal marginal zone B-cell lymphoma of mucosa-associated lymphoid tissue [MALT-lymphoma]: Secondary | ICD-10-CM | POA: Diagnosis not present

## 2018-03-03 DIAGNOSIS — I480 Paroxysmal atrial fibrillation: Secondary | ICD-10-CM | POA: Diagnosis not present

## 2018-03-03 DIAGNOSIS — Z6825 Body mass index (BMI) 25.0-25.9, adult: Secondary | ICD-10-CM | POA: Diagnosis not present

## 2018-03-03 DIAGNOSIS — D801 Nonfamilial hypogammaglobulinemia: Secondary | ICD-10-CM | POA: Diagnosis not present

## 2018-03-03 DIAGNOSIS — N401 Enlarged prostate with lower urinary tract symptoms: Secondary | ICD-10-CM | POA: Diagnosis not present

## 2018-03-03 DIAGNOSIS — Z79899 Other long term (current) drug therapy: Secondary | ICD-10-CM | POA: Diagnosis not present

## 2018-03-04 DIAGNOSIS — I495 Sick sinus syndrome: Secondary | ICD-10-CM | POA: Diagnosis not present

## 2018-03-04 DIAGNOSIS — Z7901 Long term (current) use of anticoagulants: Secondary | ICD-10-CM | POA: Diagnosis not present

## 2018-03-04 DIAGNOSIS — D46Z Other myelodysplastic syndromes: Secondary | ICD-10-CM | POA: Diagnosis not present

## 2018-03-04 DIAGNOSIS — Z79899 Other long term (current) drug therapy: Secondary | ICD-10-CM | POA: Diagnosis not present

## 2018-03-04 DIAGNOSIS — R0602 Shortness of breath: Secondary | ICD-10-CM | POA: Diagnosis not present

## 2018-03-04 DIAGNOSIS — R0609 Other forms of dyspnea: Secondary | ICD-10-CM | POA: Diagnosis not present

## 2018-03-04 DIAGNOSIS — I2510 Atherosclerotic heart disease of native coronary artery without angina pectoris: Secondary | ICD-10-CM | POA: Diagnosis not present

## 2018-03-04 DIAGNOSIS — I480 Paroxysmal atrial fibrillation: Secondary | ICD-10-CM | POA: Diagnosis not present

## 2018-03-04 DIAGNOSIS — R609 Edema, unspecified: Secondary | ICD-10-CM | POA: Diagnosis not present

## 2018-03-04 DIAGNOSIS — I359 Nonrheumatic aortic valve disorder, unspecified: Secondary | ICD-10-CM | POA: Diagnosis not present

## 2018-03-04 DIAGNOSIS — Z950 Presence of cardiac pacemaker: Secondary | ICD-10-CM | POA: Diagnosis not present

## 2018-03-04 DIAGNOSIS — D469 Myelodysplastic syndrome, unspecified: Secondary | ICD-10-CM | POA: Diagnosis not present

## 2018-03-04 DIAGNOSIS — I9589 Other hypotension: Secondary | ICD-10-CM | POA: Diagnosis not present

## 2018-03-05 DIAGNOSIS — Z79899 Other long term (current) drug therapy: Secondary | ICD-10-CM | POA: Diagnosis not present

## 2018-03-05 DIAGNOSIS — D469 Myelodysplastic syndrome, unspecified: Secondary | ICD-10-CM | POA: Diagnosis not present

## 2018-03-05 DIAGNOSIS — Z5111 Encounter for antineoplastic chemotherapy: Secondary | ICD-10-CM | POA: Diagnosis not present

## 2018-03-06 DIAGNOSIS — Z79899 Other long term (current) drug therapy: Secondary | ICD-10-CM | POA: Diagnosis not present

## 2018-03-06 DIAGNOSIS — Z5111 Encounter for antineoplastic chemotherapy: Secondary | ICD-10-CM | POA: Diagnosis not present

## 2018-03-06 DIAGNOSIS — D469 Myelodysplastic syndrome, unspecified: Secondary | ICD-10-CM | POA: Diagnosis not present

## 2018-03-07 DIAGNOSIS — Z1212 Encounter for screening for malignant neoplasm of rectum: Secondary | ICD-10-CM | POA: Diagnosis not present

## 2018-03-07 DIAGNOSIS — D46Z Other myelodysplastic syndromes: Secondary | ICD-10-CM | POA: Diagnosis not present

## 2018-03-07 DIAGNOSIS — Z5111 Encounter for antineoplastic chemotherapy: Secondary | ICD-10-CM | POA: Diagnosis not present

## 2018-03-07 DIAGNOSIS — R351 Nocturia: Secondary | ICD-10-CM | POA: Diagnosis not present

## 2018-03-07 DIAGNOSIS — N5201 Erectile dysfunction due to arterial insufficiency: Secondary | ICD-10-CM | POA: Diagnosis not present

## 2018-03-07 DIAGNOSIS — Z79899 Other long term (current) drug therapy: Secondary | ICD-10-CM | POA: Diagnosis not present

## 2018-03-18 DIAGNOSIS — T451X5A Adverse effect of antineoplastic and immunosuppressive drugs, initial encounter: Secondary | ICD-10-CM | POA: Diagnosis not present

## 2018-03-18 DIAGNOSIS — D469 Myelodysplastic syndrome, unspecified: Secondary | ICD-10-CM | POA: Diagnosis not present

## 2018-03-18 DIAGNOSIS — I480 Paroxysmal atrial fibrillation: Secondary | ICD-10-CM | POA: Diagnosis not present

## 2018-03-18 DIAGNOSIS — G4700 Insomnia, unspecified: Secondary | ICD-10-CM | POA: Diagnosis not present

## 2018-03-18 DIAGNOSIS — Z85840 Personal history of malignant neoplasm of eye: Secondary | ICD-10-CM | POA: Diagnosis not present

## 2018-03-18 DIAGNOSIS — Z9221 Personal history of antineoplastic chemotherapy: Secondary | ICD-10-CM | POA: Diagnosis not present

## 2018-03-18 DIAGNOSIS — Z79899 Other long term (current) drug therapy: Secondary | ICD-10-CM | POA: Diagnosis not present

## 2018-03-18 DIAGNOSIS — D801 Nonfamilial hypogammaglobulinemia: Secondary | ICD-10-CM | POA: Diagnosis not present

## 2018-03-18 DIAGNOSIS — K5903 Drug induced constipation: Secondary | ICD-10-CM | POA: Diagnosis not present

## 2018-03-18 DIAGNOSIS — R110 Nausea: Secondary | ICD-10-CM | POA: Diagnosis not present

## 2018-03-18 DIAGNOSIS — Z7901 Long term (current) use of anticoagulants: Secondary | ICD-10-CM | POA: Diagnosis not present

## 2018-03-18 DIAGNOSIS — L2089 Other atopic dermatitis: Secondary | ICD-10-CM | POA: Diagnosis not present

## 2018-03-18 DIAGNOSIS — D46Z Other myelodysplastic syndromes: Secondary | ICD-10-CM | POA: Diagnosis not present

## 2018-03-25 DIAGNOSIS — Z950 Presence of cardiac pacemaker: Secondary | ICD-10-CM | POA: Diagnosis not present

## 2018-03-25 DIAGNOSIS — K319 Disease of stomach and duodenum, unspecified: Secondary | ICD-10-CM | POA: Diagnosis not present

## 2018-03-25 DIAGNOSIS — I480 Paroxysmal atrial fibrillation: Secondary | ICD-10-CM | POA: Diagnosis not present

## 2018-03-25 DIAGNOSIS — I495 Sick sinus syndrome: Secondary | ICD-10-CM | POA: Diagnosis not present

## 2018-03-25 DIAGNOSIS — D801 Nonfamilial hypogammaglobulinemia: Secondary | ICD-10-CM | POA: Diagnosis not present

## 2018-03-25 DIAGNOSIS — Z7901 Long term (current) use of anticoagulants: Secondary | ICD-10-CM | POA: Diagnosis not present

## 2018-03-25 DIAGNOSIS — T451X5A Adverse effect of antineoplastic and immunosuppressive drugs, initial encounter: Secondary | ICD-10-CM | POA: Diagnosis not present

## 2018-03-25 DIAGNOSIS — C884 Extranodal marginal zone B-cell lymphoma of mucosa-associated lymphoid tissue [MALT-lymphoma]: Secondary | ICD-10-CM | POA: Diagnosis not present

## 2018-03-25 DIAGNOSIS — Z9221 Personal history of antineoplastic chemotherapy: Secondary | ICD-10-CM | POA: Diagnosis not present

## 2018-03-25 DIAGNOSIS — G4700 Insomnia, unspecified: Secondary | ICD-10-CM | POA: Diagnosis not present

## 2018-03-25 DIAGNOSIS — D469 Myelodysplastic syndrome, unspecified: Secondary | ICD-10-CM | POA: Diagnosis not present

## 2018-03-25 DIAGNOSIS — L989 Disorder of the skin and subcutaneous tissue, unspecified: Secondary | ICD-10-CM | POA: Diagnosis not present

## 2018-03-25 DIAGNOSIS — L308 Other specified dermatitis: Secondary | ICD-10-CM | POA: Diagnosis not present

## 2018-03-25 DIAGNOSIS — D46Z Other myelodysplastic syndromes: Secondary | ICD-10-CM | POA: Diagnosis not present

## 2018-03-25 DIAGNOSIS — R110 Nausea: Secondary | ICD-10-CM | POA: Diagnosis not present

## 2018-03-25 DIAGNOSIS — K5903 Drug induced constipation: Secondary | ICD-10-CM | POA: Diagnosis not present

## 2018-03-25 DIAGNOSIS — J329 Chronic sinusitis, unspecified: Secondary | ICD-10-CM | POA: Diagnosis not present

## 2018-03-31 DIAGNOSIS — D801 Nonfamilial hypogammaglobulinemia: Secondary | ICD-10-CM | POA: Diagnosis not present

## 2018-03-31 DIAGNOSIS — Z79899 Other long term (current) drug therapy: Secondary | ICD-10-CM | POA: Diagnosis not present

## 2018-04-01 DIAGNOSIS — D801 Nonfamilial hypogammaglobulinemia: Secondary | ICD-10-CM | POA: Diagnosis not present

## 2018-04-01 DIAGNOSIS — Z79899 Other long term (current) drug therapy: Secondary | ICD-10-CM | POA: Diagnosis not present

## 2018-04-01 DIAGNOSIS — D469 Myelodysplastic syndrome, unspecified: Secondary | ICD-10-CM | POA: Diagnosis not present

## 2018-04-02 DIAGNOSIS — Z79899 Other long term (current) drug therapy: Secondary | ICD-10-CM | POA: Diagnosis not present

## 2018-04-02 DIAGNOSIS — D469 Myelodysplastic syndrome, unspecified: Secondary | ICD-10-CM | POA: Diagnosis not present

## 2018-04-02 DIAGNOSIS — Z5111 Encounter for antineoplastic chemotherapy: Secondary | ICD-10-CM | POA: Diagnosis not present

## 2018-04-03 DIAGNOSIS — Z5111 Encounter for antineoplastic chemotherapy: Secondary | ICD-10-CM | POA: Diagnosis not present

## 2018-04-03 DIAGNOSIS — D469 Myelodysplastic syndrome, unspecified: Secondary | ICD-10-CM | POA: Diagnosis not present

## 2018-04-03 DIAGNOSIS — Z79899 Other long term (current) drug therapy: Secondary | ICD-10-CM | POA: Diagnosis not present

## 2018-04-04 DIAGNOSIS — D469 Myelodysplastic syndrome, unspecified: Secondary | ICD-10-CM | POA: Diagnosis not present

## 2018-04-04 DIAGNOSIS — Z79899 Other long term (current) drug therapy: Secondary | ICD-10-CM | POA: Diagnosis not present

## 2018-04-04 DIAGNOSIS — Z5111 Encounter for antineoplastic chemotherapy: Secondary | ICD-10-CM | POA: Diagnosis not present

## 2018-04-08 DIAGNOSIS — J328 Other chronic sinusitis: Secondary | ICD-10-CM | POA: Diagnosis not present

## 2018-04-08 DIAGNOSIS — R110 Nausea: Secondary | ICD-10-CM | POA: Diagnosis not present

## 2018-04-08 DIAGNOSIS — D469 Myelodysplastic syndrome, unspecified: Secondary | ICD-10-CM | POA: Diagnosis not present

## 2018-04-08 DIAGNOSIS — G4700 Insomnia, unspecified: Secondary | ICD-10-CM | POA: Diagnosis not present

## 2018-04-08 DIAGNOSIS — Z8572 Personal history of non-Hodgkin lymphomas: Secondary | ICD-10-CM | POA: Diagnosis not present

## 2018-04-08 DIAGNOSIS — Z7901 Long term (current) use of anticoagulants: Secondary | ICD-10-CM | POA: Diagnosis not present

## 2018-04-08 DIAGNOSIS — K5909 Other constipation: Secondary | ICD-10-CM | POA: Diagnosis not present

## 2018-04-08 DIAGNOSIS — Z792 Long term (current) use of antibiotics: Secondary | ICD-10-CM | POA: Diagnosis not present

## 2018-04-08 DIAGNOSIS — Z95828 Presence of other vascular implants and grafts: Secondary | ICD-10-CM | POA: Diagnosis not present

## 2018-04-08 DIAGNOSIS — L209 Atopic dermatitis, unspecified: Secondary | ICD-10-CM | POA: Diagnosis not present

## 2018-04-08 DIAGNOSIS — J329 Chronic sinusitis, unspecified: Secondary | ICD-10-CM | POA: Diagnosis not present

## 2018-04-08 DIAGNOSIS — Z950 Presence of cardiac pacemaker: Secondary | ICD-10-CM | POA: Diagnosis not present

## 2018-04-08 DIAGNOSIS — R5383 Other fatigue: Secondary | ICD-10-CM | POA: Diagnosis not present

## 2018-04-08 DIAGNOSIS — I495 Sick sinus syndrome: Secondary | ICD-10-CM | POA: Diagnosis not present

## 2018-04-08 DIAGNOSIS — K5900 Constipation, unspecified: Secondary | ICD-10-CM | POA: Diagnosis not present

## 2018-04-08 DIAGNOSIS — Z959 Presence of cardiac and vascular implant and graft, unspecified: Secondary | ICD-10-CM | POA: Diagnosis not present

## 2018-04-08 DIAGNOSIS — L308 Other specified dermatitis: Secondary | ICD-10-CM | POA: Diagnosis not present

## 2018-04-08 DIAGNOSIS — D801 Nonfamilial hypogammaglobulinemia: Secondary | ICD-10-CM | POA: Diagnosis not present

## 2018-04-08 DIAGNOSIS — T451X5A Adverse effect of antineoplastic and immunosuppressive drugs, initial encounter: Secondary | ICD-10-CM | POA: Diagnosis not present

## 2018-04-08 DIAGNOSIS — I480 Paroxysmal atrial fibrillation: Secondary | ICD-10-CM | POA: Diagnosis not present

## 2018-04-08 DIAGNOSIS — Z5111 Encounter for antineoplastic chemotherapy: Secondary | ICD-10-CM | POA: Diagnosis not present

## 2018-04-10 ENCOUNTER — Encounter: Payer: Medicare Other | Admitting: *Deleted

## 2018-04-10 ENCOUNTER — Telehealth: Payer: Self-pay | Admitting: Cardiology

## 2018-04-10 NOTE — Telephone Encounter (Signed)
LMOVM reminding pt to send remote transmission.   

## 2018-04-11 ENCOUNTER — Encounter: Payer: Self-pay | Admitting: Cardiology

## 2018-04-22 DIAGNOSIS — T451X5A Adverse effect of antineoplastic and immunosuppressive drugs, initial encounter: Secondary | ICD-10-CM | POA: Diagnosis not present

## 2018-04-22 DIAGNOSIS — R490 Dysphonia: Secondary | ICD-10-CM | POA: Diagnosis not present

## 2018-04-22 DIAGNOSIS — D469 Myelodysplastic syndrome, unspecified: Secondary | ICD-10-CM | POA: Diagnosis not present

## 2018-04-22 DIAGNOSIS — H9193 Unspecified hearing loss, bilateral: Secondary | ICD-10-CM | POA: Diagnosis not present

## 2018-04-22 DIAGNOSIS — R110 Nausea: Secondary | ICD-10-CM | POA: Diagnosis not present

## 2018-04-22 DIAGNOSIS — K5903 Drug induced constipation: Secondary | ICD-10-CM | POA: Diagnosis not present

## 2018-04-22 DIAGNOSIS — R21 Rash and other nonspecific skin eruption: Secondary | ICD-10-CM | POA: Diagnosis not present

## 2018-04-22 DIAGNOSIS — I480 Paroxysmal atrial fibrillation: Secondary | ICD-10-CM | POA: Diagnosis not present

## 2018-04-22 DIAGNOSIS — Z7901 Long term (current) use of anticoagulants: Secondary | ICD-10-CM | POA: Diagnosis not present

## 2018-04-22 DIAGNOSIS — J338 Other polyp of sinus: Secondary | ICD-10-CM | POA: Diagnosis not present

## 2018-04-22 DIAGNOSIS — K5900 Constipation, unspecified: Secondary | ICD-10-CM | POA: Diagnosis not present

## 2018-04-22 DIAGNOSIS — D801 Nonfamilial hypogammaglobulinemia: Secondary | ICD-10-CM | POA: Diagnosis not present

## 2018-04-22 DIAGNOSIS — J324 Chronic pansinusitis: Secondary | ICD-10-CM | POA: Diagnosis not present

## 2018-04-22 DIAGNOSIS — J339 Nasal polyp, unspecified: Secondary | ICD-10-CM | POA: Diagnosis not present

## 2018-04-22 DIAGNOSIS — Z8572 Personal history of non-Hodgkin lymphomas: Secondary | ICD-10-CM | POA: Diagnosis not present

## 2018-04-22 DIAGNOSIS — Z8701 Personal history of pneumonia (recurrent): Secondary | ICD-10-CM | POA: Diagnosis not present

## 2018-04-22 DIAGNOSIS — C884 Extranodal marginal zone B-cell lymphoma of mucosa-associated lymphoid tissue [MALT-lymphoma]: Secondary | ICD-10-CM | POA: Diagnosis not present

## 2018-04-22 DIAGNOSIS — Z950 Presence of cardiac pacemaker: Secondary | ICD-10-CM | POA: Diagnosis not present

## 2018-04-22 DIAGNOSIS — Z23 Encounter for immunization: Secondary | ICD-10-CM | POA: Diagnosis not present

## 2018-04-22 DIAGNOSIS — R0982 Postnasal drip: Secondary | ICD-10-CM | POA: Diagnosis not present

## 2018-04-22 DIAGNOSIS — I495 Sick sinus syndrome: Secondary | ICD-10-CM | POA: Diagnosis not present

## 2018-04-22 DIAGNOSIS — R05 Cough: Secondary | ICD-10-CM | POA: Diagnosis not present

## 2018-04-24 ENCOUNTER — Ambulatory Visit (INDEPENDENT_AMBULATORY_CARE_PROVIDER_SITE_OTHER): Payer: Medicare Other | Admitting: *Deleted

## 2018-04-24 DIAGNOSIS — I4891 Unspecified atrial fibrillation: Secondary | ICD-10-CM

## 2018-04-24 DIAGNOSIS — Z950 Presence of cardiac pacemaker: Secondary | ICD-10-CM

## 2018-04-25 NOTE — Progress Notes (Signed)
Remote pacemaker transmission.   

## 2018-05-12 DIAGNOSIS — D469 Myelodysplastic syndrome, unspecified: Secondary | ICD-10-CM | POA: Diagnosis not present

## 2018-05-12 DIAGNOSIS — D801 Nonfamilial hypogammaglobulinemia: Secondary | ICD-10-CM | POA: Diagnosis not present

## 2018-05-12 DIAGNOSIS — Z5111 Encounter for antineoplastic chemotherapy: Secondary | ICD-10-CM | POA: Diagnosis not present

## 2018-05-12 DIAGNOSIS — Z79899 Other long term (current) drug therapy: Secondary | ICD-10-CM | POA: Diagnosis not present

## 2018-05-13 DIAGNOSIS — D801 Nonfamilial hypogammaglobulinemia: Secondary | ICD-10-CM | POA: Diagnosis not present

## 2018-05-13 DIAGNOSIS — D469 Myelodysplastic syndrome, unspecified: Secondary | ICD-10-CM | POA: Diagnosis not present

## 2018-05-13 DIAGNOSIS — Z950 Presence of cardiac pacemaker: Secondary | ICD-10-CM | POA: Diagnosis not present

## 2018-05-13 DIAGNOSIS — G4700 Insomnia, unspecified: Secondary | ICD-10-CM | POA: Diagnosis not present

## 2018-05-13 DIAGNOSIS — K5903 Drug induced constipation: Secondary | ICD-10-CM | POA: Diagnosis not present

## 2018-05-13 DIAGNOSIS — D46Z Other myelodysplastic syndromes: Secondary | ICD-10-CM | POA: Diagnosis not present

## 2018-05-13 DIAGNOSIS — Z95828 Presence of other vascular implants and grafts: Secondary | ICD-10-CM | POA: Diagnosis not present

## 2018-05-13 DIAGNOSIS — I495 Sick sinus syndrome: Secondary | ICD-10-CM | POA: Diagnosis not present

## 2018-05-13 DIAGNOSIS — R110 Nausea: Secondary | ICD-10-CM | POA: Diagnosis not present

## 2018-05-13 DIAGNOSIS — L2089 Other atopic dermatitis: Secondary | ICD-10-CM | POA: Diagnosis not present

## 2018-05-13 DIAGNOSIS — Z8572 Personal history of non-Hodgkin lymphomas: Secondary | ICD-10-CM | POA: Diagnosis not present

## 2018-05-13 DIAGNOSIS — J324 Chronic pansinusitis: Secondary | ICD-10-CM | POA: Diagnosis not present

## 2018-05-13 DIAGNOSIS — T451X5A Adverse effect of antineoplastic and immunosuppressive drugs, initial encounter: Secondary | ICD-10-CM | POA: Diagnosis not present

## 2018-05-13 DIAGNOSIS — Z79899 Other long term (current) drug therapy: Secondary | ICD-10-CM | POA: Diagnosis not present

## 2018-05-13 DIAGNOSIS — I480 Paroxysmal atrial fibrillation: Secondary | ICD-10-CM | POA: Diagnosis not present

## 2018-05-14 DIAGNOSIS — Z5111 Encounter for antineoplastic chemotherapy: Secondary | ICD-10-CM | POA: Diagnosis not present

## 2018-05-14 DIAGNOSIS — D469 Myelodysplastic syndrome, unspecified: Secondary | ICD-10-CM | POA: Diagnosis not present

## 2018-05-14 DIAGNOSIS — Z79899 Other long term (current) drug therapy: Secondary | ICD-10-CM | POA: Diagnosis not present

## 2018-05-15 DIAGNOSIS — Z79899 Other long term (current) drug therapy: Secondary | ICD-10-CM | POA: Diagnosis not present

## 2018-05-15 DIAGNOSIS — Z5111 Encounter for antineoplastic chemotherapy: Secondary | ICD-10-CM | POA: Diagnosis not present

## 2018-05-15 DIAGNOSIS — D469 Myelodysplastic syndrome, unspecified: Secondary | ICD-10-CM | POA: Diagnosis not present

## 2018-05-16 DIAGNOSIS — Z79899 Other long term (current) drug therapy: Secondary | ICD-10-CM | POA: Diagnosis not present

## 2018-05-16 DIAGNOSIS — D469 Myelodysplastic syndrome, unspecified: Secondary | ICD-10-CM | POA: Diagnosis not present

## 2018-05-16 DIAGNOSIS — Z5111 Encounter for antineoplastic chemotherapy: Secondary | ICD-10-CM | POA: Diagnosis not present

## 2018-06-23 DIAGNOSIS — Z79899 Other long term (current) drug therapy: Secondary | ICD-10-CM | POA: Diagnosis not present

## 2018-06-23 DIAGNOSIS — D469 Myelodysplastic syndrome, unspecified: Secondary | ICD-10-CM | POA: Diagnosis not present

## 2018-06-23 DIAGNOSIS — Z5111 Encounter for antineoplastic chemotherapy: Secondary | ICD-10-CM | POA: Diagnosis not present

## 2018-06-24 DIAGNOSIS — I480 Paroxysmal atrial fibrillation: Secondary | ICD-10-CM | POA: Diagnosis not present

## 2018-06-24 DIAGNOSIS — R05 Cough: Secondary | ICD-10-CM | POA: Diagnosis not present

## 2018-06-24 DIAGNOSIS — K5900 Constipation, unspecified: Secondary | ICD-10-CM | POA: Diagnosis not present

## 2018-06-24 DIAGNOSIS — T451X5A Adverse effect of antineoplastic and immunosuppressive drugs, initial encounter: Secondary | ICD-10-CM | POA: Diagnosis not present

## 2018-06-24 DIAGNOSIS — Z7901 Long term (current) use of anticoagulants: Secondary | ICD-10-CM | POA: Diagnosis not present

## 2018-06-24 DIAGNOSIS — D469 Myelodysplastic syndrome, unspecified: Secondary | ICD-10-CM | POA: Diagnosis not present

## 2018-06-24 DIAGNOSIS — Z79899 Other long term (current) drug therapy: Secondary | ICD-10-CM | POA: Diagnosis not present

## 2018-06-24 DIAGNOSIS — D801 Nonfamilial hypogammaglobulinemia: Secondary | ICD-10-CM | POA: Diagnosis not present

## 2018-06-24 DIAGNOSIS — L309 Dermatitis, unspecified: Secondary | ICD-10-CM | POA: Diagnosis not present

## 2018-06-24 DIAGNOSIS — Z959 Presence of cardiac and vascular implant and graft, unspecified: Secondary | ICD-10-CM | POA: Diagnosis not present

## 2018-06-24 DIAGNOSIS — Z950 Presence of cardiac pacemaker: Secondary | ICD-10-CM | POA: Diagnosis not present

## 2018-06-24 DIAGNOSIS — I495 Sick sinus syndrome: Secondary | ICD-10-CM | POA: Diagnosis not present

## 2018-06-24 DIAGNOSIS — Z87898 Personal history of other specified conditions: Secondary | ICD-10-CM | POA: Diagnosis not present

## 2018-06-24 DIAGNOSIS — G4700 Insomnia, unspecified: Secondary | ICD-10-CM | POA: Diagnosis not present

## 2018-06-24 DIAGNOSIS — R110 Nausea: Secondary | ICD-10-CM | POA: Diagnosis not present

## 2018-06-25 DIAGNOSIS — D469 Myelodysplastic syndrome, unspecified: Secondary | ICD-10-CM | POA: Diagnosis not present

## 2018-06-25 DIAGNOSIS — Z5111 Encounter for antineoplastic chemotherapy: Secondary | ICD-10-CM | POA: Diagnosis not present

## 2018-06-26 DIAGNOSIS — D469 Myelodysplastic syndrome, unspecified: Secondary | ICD-10-CM | POA: Diagnosis not present

## 2018-06-26 DIAGNOSIS — Z5111 Encounter for antineoplastic chemotherapy: Secondary | ICD-10-CM | POA: Diagnosis not present

## 2018-06-26 LAB — CUP PACEART REMOTE DEVICE CHECK
Battery Voltage: 2.96 V
Brady Statistic AP VP Percent: 81.63 %
Brady Statistic AP VS Percent: 0.08 %
Brady Statistic AS VP Percent: 18.11 %
Brady Statistic AS VS Percent: 0.17 %
Implantable Lead Implant Date: 20071115
Implantable Lead Location: 753859
Implantable Lead Model: 5076
Implantable Lead Model: 5076
Lead Channel Impedance Value: 361 Ohm
Lead Channel Impedance Value: 399 Ohm
Lead Channel Pacing Threshold Amplitude: 1.5 V
Lead Channel Pacing Threshold Pulse Width: 0.4 ms
Lead Channel Sensing Intrinsic Amplitude: 1.875 mV
Lead Channel Sensing Intrinsic Amplitude: 1.875 mV
Lead Channel Sensing Intrinsic Amplitude: 12.875 mV
Lead Channel Sensing Intrinsic Amplitude: 12.875 mV
Lead Channel Setting Pacing Amplitude: 2 V
Lead Channel Setting Pacing Amplitude: 2.5 V
Lead Channel Setting Pacing Pulse Width: 0.7 ms
MDC IDC LEAD IMPLANT DT: 20071115
MDC IDC LEAD LOCATION: 753860
MDC IDC MSMT BATTERY REMAINING LONGEVITY: 33 mo
MDC IDC MSMT LEADCHNL RA PACING THRESHOLD AMPLITUDE: 0.875 V
MDC IDC MSMT LEADCHNL RA PACING THRESHOLD PULSEWIDTH: 0.4 ms
MDC IDC MSMT LEADCHNL RV IMPEDANCE VALUE: 304 Ohm
MDC IDC MSMT LEADCHNL RV IMPEDANCE VALUE: 380 Ohm
MDC IDC PG IMPLANT DT: 20150508
MDC IDC SESS DTM: 20191018004125
MDC IDC SET LEADCHNL RV SENSING SENSITIVITY: 2.8 mV
MDC IDC STAT BRADY RA PERCENT PACED: 80.72 %
MDC IDC STAT BRADY RV PERCENT PACED: 99.63 %

## 2018-06-27 DIAGNOSIS — D469 Myelodysplastic syndrome, unspecified: Secondary | ICD-10-CM | POA: Diagnosis not present

## 2018-06-27 DIAGNOSIS — Z79899 Other long term (current) drug therapy: Secondary | ICD-10-CM | POA: Diagnosis not present

## 2018-07-24 ENCOUNTER — Ambulatory Visit: Payer: Medicare Other

## 2018-07-25 ENCOUNTER — Encounter: Payer: Self-pay | Admitting: Cardiology

## 2018-07-26 LAB — CUP PACEART REMOTE DEVICE CHECK
Battery Remaining Longevity: 30 mo
Brady Statistic AP VS Percent: 0.13 %
Brady Statistic AS VP Percent: 17.34 %
Brady Statistic AS VS Percent: 0.2 %
Brady Statistic RV Percent Paced: 99.47 %
Implantable Lead Implant Date: 20071115
Implantable Lead Location: 753859
Implantable Lead Location: 753860
Implantable Lead Model: 5076
Implantable Pulse Generator Implant Date: 20150508
Lead Channel Impedance Value: 304 Ohm
Lead Channel Impedance Value: 361 Ohm
Lead Channel Impedance Value: 399 Ohm
Lead Channel Pacing Threshold Pulse Width: 0.4 ms
Lead Channel Sensing Intrinsic Amplitude: 1.75 mV
Lead Channel Sensing Intrinsic Amplitude: 1.75 mV
Lead Channel Sensing Intrinsic Amplitude: 10 mV
Lead Channel Sensing Intrinsic Amplitude: 10 mV
Lead Channel Setting Pacing Amplitude: 2.25 V
Lead Channel Setting Pacing Amplitude: 2.5 V
Lead Channel Setting Pacing Pulse Width: 0.7 ms
Lead Channel Setting Sensing Sensitivity: 2.8 mV
MDC IDC LEAD IMPLANT DT: 20071115
MDC IDC MSMT BATTERY VOLTAGE: 2.96 V
MDC IDC MSMT LEADCHNL RA IMPEDANCE VALUE: 399 Ohm
MDC IDC MSMT LEADCHNL RA PACING THRESHOLD AMPLITUDE: 1.125 V
MDC IDC MSMT LEADCHNL RA PACING THRESHOLD PULSEWIDTH: 0.4 ms
MDC IDC MSMT LEADCHNL RV PACING THRESHOLD AMPLITUDE: 1.625 V
MDC IDC SESS DTM: 20200118145219
MDC IDC STAT BRADY AP VP PERCENT: 82.32 %
MDC IDC STAT BRADY RA PERCENT PACED: 81.46 %

## 2018-07-29 DIAGNOSIS — D469 Myelodysplastic syndrome, unspecified: Secondary | ICD-10-CM | POA: Diagnosis not present

## 2018-07-29 DIAGNOSIS — Z79899 Other long term (current) drug therapy: Secondary | ICD-10-CM | POA: Diagnosis not present

## 2018-07-29 DIAGNOSIS — R110 Nausea: Secondary | ICD-10-CM | POA: Diagnosis not present

## 2018-07-29 DIAGNOSIS — Z87898 Personal history of other specified conditions: Secondary | ICD-10-CM | POA: Diagnosis not present

## 2018-07-29 DIAGNOSIS — R5383 Other fatigue: Secondary | ICD-10-CM | POA: Diagnosis not present

## 2018-07-29 DIAGNOSIS — Z7901 Long term (current) use of anticoagulants: Secondary | ICD-10-CM | POA: Diagnosis not present

## 2018-07-29 DIAGNOSIS — T451X5A Adverse effect of antineoplastic and immunosuppressive drugs, initial encounter: Secondary | ICD-10-CM | POA: Diagnosis not present

## 2018-07-29 DIAGNOSIS — I480 Paroxysmal atrial fibrillation: Secondary | ICD-10-CM | POA: Diagnosis not present

## 2018-07-29 DIAGNOSIS — L209 Atopic dermatitis, unspecified: Secondary | ICD-10-CM | POA: Diagnosis not present

## 2018-07-29 DIAGNOSIS — I495 Sick sinus syndrome: Secondary | ICD-10-CM | POA: Diagnosis not present

## 2018-07-29 DIAGNOSIS — K5903 Drug induced constipation: Secondary | ICD-10-CM | POA: Diagnosis not present

## 2018-07-29 DIAGNOSIS — R0602 Shortness of breath: Secondary | ICD-10-CM | POA: Diagnosis not present

## 2018-07-29 DIAGNOSIS — Z950 Presence of cardiac pacemaker: Secondary | ICD-10-CM | POA: Diagnosis not present

## 2018-07-29 DIAGNOSIS — G4700 Insomnia, unspecified: Secondary | ICD-10-CM | POA: Diagnosis not present

## 2018-07-29 DIAGNOSIS — D801 Nonfamilial hypogammaglobulinemia: Secondary | ICD-10-CM | POA: Diagnosis not present

## 2018-07-30 DIAGNOSIS — Z79899 Other long term (current) drug therapy: Secondary | ICD-10-CM | POA: Diagnosis not present

## 2018-07-30 DIAGNOSIS — Z5111 Encounter for antineoplastic chemotherapy: Secondary | ICD-10-CM | POA: Diagnosis not present

## 2018-07-30 DIAGNOSIS — D469 Myelodysplastic syndrome, unspecified: Secondary | ICD-10-CM | POA: Diagnosis not present

## 2018-07-31 DIAGNOSIS — D469 Myelodysplastic syndrome, unspecified: Secondary | ICD-10-CM | POA: Diagnosis not present

## 2018-07-31 DIAGNOSIS — Z79899 Other long term (current) drug therapy: Secondary | ICD-10-CM | POA: Diagnosis not present

## 2018-07-31 DIAGNOSIS — Z5111 Encounter for antineoplastic chemotherapy: Secondary | ICD-10-CM | POA: Diagnosis not present

## 2018-08-01 DIAGNOSIS — Z5111 Encounter for antineoplastic chemotherapy: Secondary | ICD-10-CM | POA: Diagnosis not present

## 2018-08-01 DIAGNOSIS — D469 Myelodysplastic syndrome, unspecified: Secondary | ICD-10-CM | POA: Diagnosis not present

## 2018-08-01 DIAGNOSIS — Z79899 Other long term (current) drug therapy: Secondary | ICD-10-CM | POA: Diagnosis not present

## 2018-08-02 DIAGNOSIS — D469 Myelodysplastic syndrome, unspecified: Secondary | ICD-10-CM | POA: Diagnosis not present

## 2018-08-05 DIAGNOSIS — D469 Myelodysplastic syndrome, unspecified: Secondary | ICD-10-CM | POA: Diagnosis not present

## 2018-08-12 DIAGNOSIS — D469 Myelodysplastic syndrome, unspecified: Secondary | ICD-10-CM | POA: Diagnosis not present

## 2018-08-13 DIAGNOSIS — J984 Other disorders of lung: Secondary | ICD-10-CM | POA: Diagnosis not present

## 2018-08-13 DIAGNOSIS — R5383 Other fatigue: Secondary | ICD-10-CM | POA: Diagnosis not present

## 2018-08-13 DIAGNOSIS — J8410 Pulmonary fibrosis, unspecified: Secondary | ICD-10-CM | POA: Diagnosis not present

## 2018-08-13 DIAGNOSIS — Z452 Encounter for adjustment and management of vascular access device: Secondary | ICD-10-CM | POA: Diagnosis not present

## 2018-08-13 DIAGNOSIS — L299 Pruritus, unspecified: Secondary | ICD-10-CM | POA: Diagnosis not present

## 2018-08-13 DIAGNOSIS — Z95828 Presence of other vascular implants and grafts: Secondary | ICD-10-CM | POA: Diagnosis not present

## 2018-08-13 DIAGNOSIS — T8089XA Other complications following infusion, transfusion and therapeutic injection, initial encounter: Secondary | ICD-10-CM | POA: Diagnosis not present

## 2018-08-13 DIAGNOSIS — L500 Allergic urticaria: Secondary | ICD-10-CM | POA: Diagnosis not present

## 2018-08-13 DIAGNOSIS — Z79899 Other long term (current) drug therapy: Secondary | ICD-10-CM | POA: Diagnosis not present

## 2018-08-13 DIAGNOSIS — D469 Myelodysplastic syndrome, unspecified: Secondary | ICD-10-CM | POA: Diagnosis not present

## 2018-08-19 DIAGNOSIS — D469 Myelodysplastic syndrome, unspecified: Secondary | ICD-10-CM | POA: Diagnosis not present

## 2018-08-26 DIAGNOSIS — D801 Nonfamilial hypogammaglobulinemia: Secondary | ICD-10-CM | POA: Diagnosis not present

## 2018-08-26 DIAGNOSIS — Z7901 Long term (current) use of anticoagulants: Secondary | ICD-10-CM | POA: Diagnosis not present

## 2018-08-26 DIAGNOSIS — R110 Nausea: Secondary | ICD-10-CM | POA: Diagnosis not present

## 2018-08-26 DIAGNOSIS — Z95828 Presence of other vascular implants and grafts: Secondary | ICD-10-CM | POA: Diagnosis not present

## 2018-08-26 DIAGNOSIS — Z79899 Other long term (current) drug therapy: Secondary | ICD-10-CM | POA: Diagnosis not present

## 2018-08-26 DIAGNOSIS — K5903 Drug induced constipation: Secondary | ICD-10-CM | POA: Diagnosis not present

## 2018-08-26 DIAGNOSIS — T451X5A Adverse effect of antineoplastic and immunosuppressive drugs, initial encounter: Secondary | ICD-10-CM | POA: Diagnosis not present

## 2018-08-26 DIAGNOSIS — G4709 Other insomnia: Secondary | ICD-10-CM | POA: Diagnosis not present

## 2018-08-26 DIAGNOSIS — I495 Sick sinus syndrome: Secondary | ICD-10-CM | POA: Diagnosis not present

## 2018-08-26 DIAGNOSIS — Z950 Presence of cardiac pacemaker: Secondary | ICD-10-CM | POA: Diagnosis not present

## 2018-08-26 DIAGNOSIS — D469 Myelodysplastic syndrome, unspecified: Secondary | ICD-10-CM | POA: Diagnosis not present

## 2018-09-01 DIAGNOSIS — D469 Myelodysplastic syndrome, unspecified: Secondary | ICD-10-CM | POA: Diagnosis not present

## 2018-09-01 DIAGNOSIS — R7301 Impaired fasting glucose: Secondary | ICD-10-CM | POA: Diagnosis not present

## 2018-09-01 DIAGNOSIS — E7849 Other hyperlipidemia: Secondary | ICD-10-CM | POA: Diagnosis not present

## 2018-09-01 DIAGNOSIS — G4700 Insomnia, unspecified: Secondary | ICD-10-CM | POA: Diagnosis not present

## 2018-09-01 DIAGNOSIS — C439 Malignant melanoma of skin, unspecified: Secondary | ICD-10-CM | POA: Diagnosis not present

## 2018-09-01 DIAGNOSIS — G629 Polyneuropathy, unspecified: Secondary | ICD-10-CM | POA: Diagnosis not present

## 2018-09-01 DIAGNOSIS — Z6824 Body mass index (BMI) 24.0-24.9, adult: Secondary | ICD-10-CM | POA: Diagnosis not present

## 2018-09-01 DIAGNOSIS — C8590 Non-Hodgkin lymphoma, unspecified, unspecified site: Secondary | ICD-10-CM | POA: Diagnosis not present

## 2018-09-01 DIAGNOSIS — I480 Paroxysmal atrial fibrillation: Secondary | ICD-10-CM | POA: Diagnosis not present

## 2018-09-01 DIAGNOSIS — N401 Enlarged prostate with lower urinary tract symptoms: Secondary | ICD-10-CM | POA: Diagnosis not present

## 2018-09-02 DIAGNOSIS — Z85828 Personal history of other malignant neoplasm of skin: Secondary | ICD-10-CM | POA: Diagnosis not present

## 2018-09-02 DIAGNOSIS — D225 Melanocytic nevi of trunk: Secondary | ICD-10-CM | POA: Diagnosis not present

## 2018-09-02 DIAGNOSIS — L821 Other seborrheic keratosis: Secondary | ICD-10-CM | POA: Diagnosis not present

## 2018-09-02 DIAGNOSIS — D1801 Hemangioma of skin and subcutaneous tissue: Secondary | ICD-10-CM | POA: Diagnosis not present

## 2018-09-02 DIAGNOSIS — D485 Neoplasm of uncertain behavior of skin: Secondary | ICD-10-CM | POA: Diagnosis not present

## 2018-09-02 DIAGNOSIS — Z85820 Personal history of malignant melanoma of skin: Secondary | ICD-10-CM | POA: Diagnosis not present

## 2018-09-02 DIAGNOSIS — C44222 Squamous cell carcinoma of skin of right ear and external auricular canal: Secondary | ICD-10-CM | POA: Diagnosis not present

## 2018-09-02 DIAGNOSIS — L570 Actinic keratosis: Secondary | ICD-10-CM | POA: Diagnosis not present

## 2018-09-03 NOTE — Progress Notes (Signed)
Cardiology Office Note:    Date:  09/04/2018   ID:  BENSEN CHADDERDON, DOB June 18, 1930, MRN [id number]  PCP:  Burnard Bunting, MD  Cardiologist:  No primary care provider on file.   Referring MD: Burnard Bunting, MD   Chief Complaint  Patient presents with  . Atrial Fibrillation    History of Present Illness:    MATTHEWS FRANKS is a 85 y.o. male with a hx of PAF, Tachy-Brady syndrome, permanent pacemaker, and long-term anticoagulation.  The patient is here to establish for longitudinal care as he transitions from Dr. Tollie Eth.  He therefore is new to our practice.Marland Kitchen  He is a retired Clinical biochemist.  Has been previously followed by Dr. Wynonia Lawman, and had pacemaker implantation by Dr. Lovena Le years ago.  Pacemaker transmissions have been followed by Dr. Wynonia Lawman.  He has no cardiopulmonary complaints.  He is accustomed to cardiac follow-up twice yearly.  Most recent device check was January 2020.  He denies cardiopulmonary complaints.  He has not had palpitations.  He is on flecainide for rhythm control.  Atrial fibrillation was his initial significant medical cardiology problem.  He denies orthopnea, PND, lower extremity edema, syncope, palpitations, transient neurological complaints.  He has not had bleeding on Eliquis.  He is tolerating Tambocor without difficulty.  Has myelodysplastic syndrome with low counts  Past Medical History:  Diagnosis Date  . CAD (coronary artery disease), native coronary artery 11/10/2013  . Cardiac pacemaker in situ    05/23/2006 Indications Tachy brady with PAF  Medtronic EnRhythm model P1501DR   Seral number  [id number] H 05/29/2006 (repositioned because of increasing thresholds and concern for microperforation) RA lead Medtronic 5076 serial number [id number] RV lead  Medtronic 5076 serial number [id number],  (repositioned on septumfor microperforation)    . Long-term (current) use of anticoagulants   . Lymphoma (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 Pacemaker   . Paroxsymal atrial fibrillation    On flecainide CHADS2VASC score 4     Past Surgical History:  Procedure Laterality Date  . APPENDECTOMY    . CARDIAC CATHETERIZATION    . FOREIGN BODY REMOVAL  05/23/2011   Procedure: FOREIGN BODY REMOVAL ADULT;  Surgeon: Wynonia Sours, MD;  Location: [facility];  Service: Orthopedics;  Laterality: Left;  left index finger  . HERNIA REPAIR    . INSERT / REPLACE / REMOVE PACEMAKER    . LESION REMOVAL  05/23/2011   Procedure: LESION REMOVAL;  Surgeon: Wynonia Sours, MD;  Location: [facility];  Service: Orthopedics;  Laterality: Left;  excision open lesion left index finger  . PERMANENT PACEMAKER GENERATOR CHANGE N/A 11/13/2013   Procedure: PERMANENT PACEMAKER GENERATOR CHANGE;  Surgeon: Evans Lance, MD;  Location: [facility] CATH LAB;  Service: Cardiovascular;  Laterality: N/A;  . TONSILLECTOMY      Current Medications: Current Meds  Medication Sig  . apixaban (ELIQUIS) 5 MG TABS tablet Take 5 mg by mouth 2 (two) times daily.  Marland Kitchen b complex vitamins tablet Take 1 tablet by mouth daily.  . Cholecalciferol (VITAMIN D3) 1000 units CAPS Take 1,000 Units by mouth daily.  . flecainide (TAMBOCOR) 150 MG  tablet Take 150 mg by mouth 2 (two) times daily.    . fluconazole (DIFLUCAN) 200 MG tablet Take 200 mg by mouth daily.  . fluticasone (FLONASE) 50 MCG/ACT nasal spray Place 1-2 sprays into both nostrils daily as needed for allergies.   Marland Kitchen levofloxacin (LEVAQUIN) 500 MG tablet Take 500 mg by mouth daily.  . Multiple Vitamin (MULTIVITAMIN) tablet Take 1 tablet by mouth  daily.  Marland Kitchen omeprazole (PRILOSEC) 20 MG capsule Take 20 mg by mouth 2 (two) times daily.  . ondansetron (ZOFRAN ODT) 4 MG disintegrating tablet Take 1 tablet (4 mg total) by mouth every 4 (four) hours as needed for nausea or vomiting.  Marland Kitchen POTASSIUM GLUCONATE PO Take 500 mg by mouth daily as needed. supplement  . senna-docusate (SENOKOT-S) 8.6-50 MG tablet Take 1 tablet by mouth 2 (two) times daily. Use as needed for constipation  . vardenafil (LEVITRA) 20 MG tablet Take 20 mg by mouth daily as needed for erectile dysfunction.  . vitamin C (ASCORBIC ACID) 500 MG tablet Take 500 mg by mouth daily.  Marland Kitchen zolpidem (AMBIEN) 10 MG tablet Take 5 mg by mouth at bedtime as needed for sleep.     Allergies:   Patient has no known allergies.   Social History   Socioeconomic History  . Marital status: Married    Spouse name: Not on file  . Number of children: Not on file  . Years of education: Not on file  . Highest education level: Not on file  Occupational History  . Not on file  Social Needs  . Financial resource strain: Not on file  . Food insecurity:    Worry: Not on file    Inability: Not on file  . Transportation needs:    Medical: Not on file    Non-medical: Not on file  Tobacco Use  . Smoking status: Never Smoker  . Smokeless tobacco: Never Used  Substance and Sexual Activity  . Alcohol use: Yes  . Drug use: No  . Sexual activity: Not on file  Lifestyle  . Physical activity:    Days per week: Not on file    Minutes per session: Not on file  . Stress: Not on file  Relationships  . Social connections:    Talks on phone: Not on file    Gets together: Not on file    Attends religious service: Not on file    Active member of club or organization: Not on file    Attends meetings of clubs or organizations: Not on file    Relationship status: Not on file  Other Topics Concern  . Not on file  Social History Narrative   Retired Pharmacist, community.     Family History: The patient's family  history is not on file.  ROS:   Please see the history of present illness.    Retired Clinical biochemist.  Depressed about his diagnosis of myelodysplastic syndrome.  Dr. Florene Glen is taking care of [facility] at [facility].  He is on targeted therapy.  All other systems reviewed and are negative.  EKGs/Labs/Other Studies Reviewed:    The following studies were reviewed today: No recent cardiac evaluation and none currently needed given absence of symptoms.  EKG:  EKG AV sequential pacing.  When compared to the last prior EKG in 2015, no significant changes noted  Recent Labs: No results found for requested labs within last 8760 hours.  Recent Lipid Panel No results found for: CHOL, TRIG, HDL, CHOLHDL, VLDL, LDLCALC, LDLDIRECT  Physical Exam:  VS:  BP 122/72   Pulse 78   Ht 5\' 10"  (1.778 m)   Wt 169 lb (76.7 kg)   SpO2 97%   BMI 24.25 kg/m     Wt Readings from Last 3 Encounters:  09/04/18 169 lb (76.7 kg)  07/19/16 169 lb (76.7 kg)  03/09/14 172 lb (78 kg)     GEN: Chronically ill appearing. No acute distress HEENT: Normal NECK: No JVD. LYMPHATICS: No lymphadenopathy CARDIAC: RRR.  No murmur, no gallop, no edema VASCULAR: no Pulses, no bruits RESPIRATORY:  Clear to auscultation without rales, wheezing or rhonchi  ABDOMEN: Soft, non-tender, non-distended, No pulsatile mass, MUSCULOSKELETAL: No deformity  SKIN: Warm and dry NEUROLOGIC:  Alert and oriented x 3 PSYCHIATRIC:  Normal affect   ASSESSMENT:    1. Atrial fibrillation, unspecified type ([place])   2. Cardiac pacemaker in situ   3. Sick sinus syndrome ([place])   4. Coronary artery disease involving native coronary artery of native heart without angina pectoris   5. Aortic valve sclerosis   6. Myelodysplastic syndrome ([place])    PLAN:    In order of problems listed above:  1. No recurrent episodes of atrial fibrillation recently.  Rhythm control with flecainide. 2. Will establish pacemaker follow-up in our device clinic and  also attached the patient to Dr. Osie Cheeks. 3. Treated with permanent pacemaker. 4. No active symptoms or medical management initiatives. 5. Clinically insignificant. 6. This is the major comorbidity currently.  Being managed by Dr. Dirk Dress at [facility]  39-month follow-up.  Earlier if any cardiac symptoms.   Medication Adjustments/Labs and Tests Ordered: Current medicines are reviewed at length with the patient today.  Concerns regarding medicines are outlined above.  Orders Placed This Encounter  Procedures  . EKG 12-Lead   No orders of the defined types were placed in this encounter.   Patient Instructions  Medication Instructions:  Your physician recommends that you continue on your current medications as directed. Please refer to the Current Medication list given to you today.  If you need a refill on your cardiac medications before your next appointment, please call your pharmacy.   Lab work: None If you have labs (blood work) drawn today and your tests are completely normal, you will receive your results only by: Marland Kitchen MyChart Message (if you have MyChart) OR . A paper copy in the mail If you have any lab test that is abnormal or we need to change your treatment, we will call you to review the results.  Testing/Procedures: None  Follow-Up: Your physician recommends that you schedule a follow-up appointment with Dr. Lovena Le for your device.   At [facility], you and your health needs are our priority.  As part of our continuing mission to provide you with exceptional heart care, we have created designated Provider Care Teams.  These Care Teams include your primary Cardiologist (physician) and Advanced Practice Providers (APPs -  Physician Assistants and Nurse Practitioners) who all work together to provide you with the care you need, when you need it. You will need a follow up appointment in 6 months.  Please call our office 2 months in  advance to schedule this appointment.  You may see Dr. Tamala Julian or one of the following Advanced Practice Providers on your designated Care Team:   Truitt Merle, NP Cecilie Kicks, NP . Kathyrn Drown, NP  Any Other Special Instructions Will Be Listed Below (If Applicable).       Signed, Mallie Mussel  Carlye Grippe, MD  09/04/2018 11:04 AM    [place]

## 2018-09-04 ENCOUNTER — Encounter: Payer: Self-pay | Admitting: Interventional Cardiology

## 2018-09-04 ENCOUNTER — Ambulatory Visit: Payer: Medicare Other | Admitting: Cardiology

## 2018-09-04 ENCOUNTER — Ambulatory Visit (INDEPENDENT_AMBULATORY_CARE_PROVIDER_SITE_OTHER): Payer: Medicare Other | Admitting: Interventional Cardiology

## 2018-09-04 VITALS — BP 122/72 | HR 78 | Ht 70.0 in | Wt 169.0 lb

## 2018-09-04 DIAGNOSIS — Z950 Presence of cardiac pacemaker: Secondary | ICD-10-CM | POA: Diagnosis not present

## 2018-09-04 DIAGNOSIS — I358 Other nonrheumatic aortic valve disorders: Secondary | ICD-10-CM

## 2018-09-04 DIAGNOSIS — I2510 Atherosclerotic heart disease of native coronary artery without angina pectoris: Secondary | ICD-10-CM

## 2018-09-04 DIAGNOSIS — I4891 Unspecified atrial fibrillation: Secondary | ICD-10-CM | POA: Diagnosis not present

## 2018-09-04 DIAGNOSIS — I495 Sick sinus syndrome: Secondary | ICD-10-CM | POA: Diagnosis not present

## 2018-09-04 DIAGNOSIS — D469 Myelodysplastic syndrome, unspecified: Secondary | ICD-10-CM | POA: Diagnosis not present

## 2018-09-04 NOTE — Patient Instructions (Signed)
Medication Instructions:  Your physician recommends that you continue on your current medications as directed. Please refer to the Current Medication list given to you today.  If you need a refill on your cardiac medications before your next appointment, please call your pharmacy.   Lab work: None If you have labs (blood work) drawn today and your tests are completely normal, you will receive your results only by: Marland Kitchen MyChart Message (if you have MyChart) OR . A paper copy in the mail If you have any lab test that is abnormal or we need to change your treatment, we will call you to review the results.  Testing/Procedures: None  Follow-Up: Your physician recommends that you schedule a follow-up appointment with Dr. Lovena Le for your device.   At [facility], you and your health needs are our priority.  As part of our continuing mission to provide you with exceptional heart care, we have created designated Provider Care Teams.  These Care Teams include your primary Cardiologist (physician) and Advanced Practice Providers (APPs -  Physician Assistants and Nurse Practitioners) who all work together to provide you with the care you need, when you need it. You will need a follow up appointment in 6 months.  Please call our office 2 months in advance to schedule this appointment.  You may see Dr. Tamala Julian or one of the following Advanced Practice Providers on your designated Care Team:   T[place]itt Merle, NP Cecilie Kicks, NP . Kathyrn Drown, NP  Any Other Special Instructions Will Be Listed Below (If Applicable).

## 2018-09-08 DIAGNOSIS — D469 Myelodysplastic syndrome, unspecified: Secondary | ICD-10-CM | POA: Diagnosis not present

## 2018-09-08 DIAGNOSIS — Z79899 Other long term (current) drug therapy: Secondary | ICD-10-CM | POA: Diagnosis not present

## 2018-09-08 DIAGNOSIS — D801 Nonfamilial hypogammaglobulinemia: Secondary | ICD-10-CM | POA: Diagnosis not present

## 2018-09-09 DIAGNOSIS — K5903 Drug induced constipation: Secondary | ICD-10-CM | POA: Diagnosis not present

## 2018-09-09 DIAGNOSIS — D801 Nonfamilial hypogammaglobulinemia: Secondary | ICD-10-CM | POA: Diagnosis not present

## 2018-09-09 DIAGNOSIS — R82998 Other abnormal findings in urine: Secondary | ICD-10-CM | POA: Diagnosis not present

## 2018-09-09 DIAGNOSIS — D469 Myelodysplastic syndrome, unspecified: Secondary | ICD-10-CM | POA: Diagnosis not present

## 2018-09-09 DIAGNOSIS — R351 Nocturia: Secondary | ICD-10-CM | POA: Diagnosis not present

## 2018-09-10 DIAGNOSIS — Z79899 Other long term (current) drug therapy: Secondary | ICD-10-CM | POA: Diagnosis not present

## 2018-09-10 DIAGNOSIS — Z5111 Encounter for antineoplastic chemotherapy: Secondary | ICD-10-CM | POA: Diagnosis not present

## 2018-09-10 DIAGNOSIS — D469 Myelodysplastic syndrome, unspecified: Secondary | ICD-10-CM | POA: Diagnosis not present

## 2018-09-11 DIAGNOSIS — D469 Myelodysplastic syndrome, unspecified: Secondary | ICD-10-CM | POA: Diagnosis not present

## 2018-09-11 DIAGNOSIS — Z79899 Other long term (current) drug therapy: Secondary | ICD-10-CM | POA: Diagnosis not present

## 2018-09-12 DIAGNOSIS — Z5111 Encounter for antineoplastic chemotherapy: Secondary | ICD-10-CM | POA: Diagnosis not present

## 2018-09-12 DIAGNOSIS — Z79899 Other long term (current) drug therapy: Secondary | ICD-10-CM | POA: Diagnosis not present

## 2018-09-12 DIAGNOSIS — D469 Myelodysplastic syndrome, unspecified: Secondary | ICD-10-CM | POA: Diagnosis not present

## 2018-09-16 DIAGNOSIS — D469 Myelodysplastic syndrome, unspecified: Secondary | ICD-10-CM | POA: Diagnosis not present

## 2018-09-16 DIAGNOSIS — Z79899 Other long term (current) drug therapy: Secondary | ICD-10-CM | POA: Diagnosis not present

## 2018-09-22 ENCOUNTER — Telehealth: Payer: Self-pay | Admitting: Cardiology

## 2018-09-22 NOTE — Telephone Encounter (Signed)
° ° °  1. Which medications need to be refilled? (please list name of each medication and dose if known) Flecainide acetate 150mg  tablet, taken twice a day  2. Which pharmacy/location (including street and city if local pharmacy) is medication to be sent to? [id number]  3. Do they need a 30 day or 90 day supply? [place]

## 2018-09-23 DIAGNOSIS — Z79899 Other long term (current) drug therapy: Secondary | ICD-10-CM | POA: Diagnosis not present

## 2018-09-23 DIAGNOSIS — D469 Myelodysplastic syndrome, unspecified: Secondary | ICD-10-CM | POA: Diagnosis not present

## 2018-09-23 MED ORDER — FLECAINIDE ACETATE 150 MG PO TABS: 150.0000 mg | ORAL_TABLET | Freq: Two times a day (BID) | ORAL | 3 refills | Status: DC

## 2018-09-23 NOTE — Telephone Encounter (Signed)
Spoke with Dr. Tamala Julian- ok to fill Flecainide. Spoke with pt and he said Flecainide and Eliquis would be all we controlled.  He gets other meds from PCP.  Recent labs in [place].

## 2018-09-23 NOTE — Telephone Encounter (Signed)
Prior Dr. Tollie Eth patient. In reviewing my first and only note, it appears that flecainide and apixaban would be the only medications that we are responsible for. We should confirm this with the patient.

## 2018-09-24 DIAGNOSIS — C44222 Squamous cell carcinoma of skin of right ear and external auricular canal: Secondary | ICD-10-CM | POA: Diagnosis not present

## 2018-09-26 DIAGNOSIS — D801 Nonfamilial hypogammaglobulinemia: Secondary | ICD-10-CM | POA: Diagnosis not present

## 2018-09-30 DIAGNOSIS — D61818 Other pancytopenia: Secondary | ICD-10-CM | POA: Diagnosis not present

## 2018-09-30 DIAGNOSIS — C9200 Acute myeloblastic leukemia, not having achieved remission: Secondary | ICD-10-CM | POA: Diagnosis not present

## 2018-09-30 DIAGNOSIS — J329 Chronic sinusitis, unspecified: Secondary | ICD-10-CM | POA: Diagnosis not present

## 2018-09-30 DIAGNOSIS — L209 Atopic dermatitis, unspecified: Secondary | ICD-10-CM | POA: Diagnosis not present

## 2018-09-30 DIAGNOSIS — K5903 Drug induced constipation: Secondary | ICD-10-CM | POA: Diagnosis not present

## 2018-09-30 DIAGNOSIS — Z8572 Personal history of non-Hodgkin lymphomas: Secondary | ICD-10-CM | POA: Diagnosis not present

## 2018-09-30 DIAGNOSIS — D801 Nonfamilial hypogammaglobulinemia: Secondary | ICD-10-CM | POA: Diagnosis not present

## 2018-09-30 DIAGNOSIS — G4700 Insomnia, unspecified: Secondary | ICD-10-CM | POA: Diagnosis not present

## 2018-09-30 DIAGNOSIS — R110 Nausea: Secondary | ICD-10-CM | POA: Diagnosis not present

## 2018-09-30 DIAGNOSIS — Z7901 Long term (current) use of anticoagulants: Secondary | ICD-10-CM | POA: Diagnosis not present

## 2018-09-30 DIAGNOSIS — I480 Paroxysmal atrial fibrillation: Secondary | ICD-10-CM | POA: Diagnosis not present

## 2018-09-30 DIAGNOSIS — Z79899 Other long term (current) drug therapy: Secondary | ICD-10-CM | POA: Diagnosis not present

## 2018-09-30 DIAGNOSIS — D469 Myelodysplastic syndrome, unspecified: Secondary | ICD-10-CM | POA: Diagnosis not present

## 2018-09-30 DIAGNOSIS — I495 Sick sinus syndrome: Secondary | ICD-10-CM | POA: Diagnosis not present

## 2018-09-30 DIAGNOSIS — Z950 Presence of cardiac pacemaker: Secondary | ICD-10-CM | POA: Diagnosis not present

## 2018-10-01 DIAGNOSIS — D469 Myelodysplastic syndrome, unspecified: Secondary | ICD-10-CM | POA: Diagnosis not present

## 2018-10-03 ENCOUNTER — Telehealth: Payer: Self-pay | Admitting: Interventional Cardiology

## 2018-10-03 DIAGNOSIS — Z7901 Long term (current) use of anticoagulants: Secondary | ICD-10-CM | POA: Diagnosis not present

## 2018-10-03 DIAGNOSIS — C9200 Acute myeloblastic leukemia, not having achieved remission: Secondary | ICD-10-CM | POA: Diagnosis not present

## 2018-10-03 DIAGNOSIS — D801 Nonfamilial hypogammaglobulinemia: Secondary | ICD-10-CM | POA: Diagnosis not present

## 2018-10-03 DIAGNOSIS — K5903 Drug induced constipation: Secondary | ICD-10-CM | POA: Diagnosis not present

## 2018-10-03 DIAGNOSIS — G4700 Insomnia, unspecified: Secondary | ICD-10-CM | POA: Diagnosis not present

## 2018-10-03 DIAGNOSIS — D696 Thrombocytopenia, unspecified: Secondary | ICD-10-CM | POA: Diagnosis not present

## 2018-10-03 DIAGNOSIS — D649 Anemia, unspecified: Secondary | ICD-10-CM | POA: Diagnosis not present

## 2018-10-03 NOTE — Telephone Encounter (Signed)
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

## 2018-10-03 NOTE — Telephone Encounter (Signed)
With Chads Vasc 2 risk score 3-4, yearly risk of stroke will be 5 to 8% off anticoagulation. Off anticoagulation, risk of bleeding related to age and underlying bleeding tendency due to hematologic malignancy will be significantly reduced. They need to discuss this with oncology, because on occasion cancer can be associated with increased risk of clotting. I be happy to talk to her if needed.

## 2018-10-03 NOTE — Telephone Encounter (Signed)
Will route to Dr. Tamala Julian to make him aware.  Mickel Baas, [place] at Dr. Abel Presto office can be reached at [phone].

## 2018-10-06 NOTE — Telephone Encounter (Signed)
Spoke with Mickel Baas and made her aware of recommendations per Dr. Tamala Julian.  Mickel Baas verbalized understanding.

## 2018-10-06 NOTE — Telephone Encounter (Signed)
Called and spoke with office staff.  They took a message and sent to Mickel Baas, NP to call back.

## 2018-10-07 DIAGNOSIS — C9200 Acute myeloblastic leukemia, not having achieved remission: Secondary | ICD-10-CM | POA: Diagnosis not present

## 2018-10-10 DIAGNOSIS — C9200 Acute myeloblastic leukemia, not having achieved remission: Secondary | ICD-10-CM | POA: Diagnosis not present

## 2018-10-10 DIAGNOSIS — D469 Myelodysplastic syndrome, unspecified: Secondary | ICD-10-CM | POA: Diagnosis not present

## 2018-10-13 DIAGNOSIS — C9200 Acute myeloblastic leukemia, not having achieved remission: Secondary | ICD-10-CM | POA: Diagnosis not present

## 2018-10-13 DIAGNOSIS — Z5111 Encounter for antineoplastic chemotherapy: Secondary | ICD-10-CM | POA: Diagnosis not present

## 2018-10-13 DIAGNOSIS — D469 Myelodysplastic syndrome, unspecified: Secondary | ICD-10-CM | POA: Diagnosis not present

## 2018-10-14 DIAGNOSIS — Z5111 Encounter for antineoplastic chemotherapy: Secondary | ICD-10-CM | POA: Diagnosis not present

## 2018-10-14 DIAGNOSIS — C9200 Acute myeloblastic leukemia, not having achieved remission: Secondary | ICD-10-CM | POA: Diagnosis not present

## 2018-10-14 DIAGNOSIS — Z79899 Other long term (current) drug therapy: Secondary | ICD-10-CM | POA: Diagnosis not present

## 2018-10-14 DIAGNOSIS — D469 Myelodysplastic syndrome, unspecified: Secondary | ICD-10-CM | POA: Diagnosis not present

## 2018-10-15 DIAGNOSIS — Z79899 Other long term (current) drug therapy: Secondary | ICD-10-CM | POA: Diagnosis not present

## 2018-10-15 DIAGNOSIS — C9200 Acute myeloblastic leukemia, not having achieved remission: Secondary | ICD-10-CM | POA: Diagnosis not present

## 2018-10-15 DIAGNOSIS — Z5111 Encounter for antineoplastic chemotherapy: Secondary | ICD-10-CM | POA: Diagnosis not present

## 2018-10-15 DIAGNOSIS — D469 Myelodysplastic syndrome, unspecified: Secondary | ICD-10-CM | POA: Diagnosis not present

## 2018-10-16 DIAGNOSIS — Z79899 Other long term (current) drug therapy: Secondary | ICD-10-CM | POA: Diagnosis not present

## 2018-10-16 DIAGNOSIS — D469 Myelodysplastic syndrome, unspecified: Secondary | ICD-10-CM | POA: Diagnosis not present

## 2018-10-16 DIAGNOSIS — Z5111 Encounter for antineoplastic chemotherapy: Secondary | ICD-10-CM | POA: Diagnosis not present

## 2018-10-16 DIAGNOSIS — C9200 Acute myeloblastic leukemia, not having achieved remission: Secondary | ICD-10-CM | POA: Diagnosis not present

## 2018-10-17 DIAGNOSIS — T451X5A Adverse effect of antineoplastic and immunosuppressive drugs, initial encounter: Secondary | ICD-10-CM | POA: Diagnosis not present

## 2018-10-17 DIAGNOSIS — K219 Gastro-esophageal reflux disease without esophagitis: Secondary | ICD-10-CM | POA: Diagnosis not present

## 2018-10-17 DIAGNOSIS — D696 Thrombocytopenia, unspecified: Secondary | ICD-10-CM | POA: Diagnosis not present

## 2018-10-17 DIAGNOSIS — I480 Paroxysmal atrial fibrillation: Secondary | ICD-10-CM | POA: Diagnosis not present

## 2018-10-17 DIAGNOSIS — Z950 Presence of cardiac pacemaker: Secondary | ICD-10-CM | POA: Diagnosis not present

## 2018-10-17 DIAGNOSIS — D801 Nonfamilial hypogammaglobulinemia: Secondary | ICD-10-CM | POA: Diagnosis not present

## 2018-10-17 DIAGNOSIS — K5903 Drug induced constipation: Secondary | ICD-10-CM | POA: Diagnosis not present

## 2018-10-17 DIAGNOSIS — C9200 Acute myeloblastic leukemia, not having achieved remission: Secondary | ICD-10-CM | POA: Diagnosis not present

## 2018-10-17 DIAGNOSIS — Z7901 Long term (current) use of anticoagulants: Secondary | ICD-10-CM | POA: Diagnosis not present

## 2018-10-17 DIAGNOSIS — R011 Cardiac murmur, unspecified: Secondary | ICD-10-CM | POA: Diagnosis not present

## 2018-10-17 DIAGNOSIS — Z79899 Other long term (current) drug therapy: Secondary | ICD-10-CM | POA: Diagnosis not present

## 2018-10-17 DIAGNOSIS — G4700 Insomnia, unspecified: Secondary | ICD-10-CM | POA: Diagnosis not present

## 2018-10-17 DIAGNOSIS — N400 Enlarged prostate without lower urinary tract symptoms: Secondary | ICD-10-CM | POA: Diagnosis not present

## 2018-10-17 DIAGNOSIS — R110 Nausea: Secondary | ICD-10-CM | POA: Diagnosis not present

## 2018-10-17 DIAGNOSIS — J329 Chronic sinusitis, unspecified: Secondary | ICD-10-CM | POA: Diagnosis not present

## 2018-10-17 DIAGNOSIS — K5900 Constipation, unspecified: Secondary | ICD-10-CM | POA: Diagnosis not present

## 2018-10-17 DIAGNOSIS — Z85828 Personal history of other malignant neoplasm of skin: Secondary | ICD-10-CM | POA: Diagnosis not present

## 2018-10-17 DIAGNOSIS — I495 Sick sinus syndrome: Secondary | ICD-10-CM | POA: Diagnosis not present

## 2018-10-21 DIAGNOSIS — D469 Myelodysplastic syndrome, unspecified: Secondary | ICD-10-CM | POA: Diagnosis not present

## 2018-10-21 DIAGNOSIS — C9200 Acute myeloblastic leukemia, not having achieved remission: Secondary | ICD-10-CM | POA: Diagnosis not present

## 2018-10-24 DIAGNOSIS — C9200 Acute myeloblastic leukemia, not having achieved remission: Secondary | ICD-10-CM | POA: Diagnosis not present

## 2018-10-28 DIAGNOSIS — C9200 Acute myeloblastic leukemia, not having achieved remission: Secondary | ICD-10-CM | POA: Diagnosis not present

## 2018-10-28 DIAGNOSIS — R39198 Other difficulties with micturition: Secondary | ICD-10-CM | POA: Diagnosis not present

## 2018-10-28 DIAGNOSIS — D469 Myelodysplastic syndrome, unspecified: Secondary | ICD-10-CM | POA: Diagnosis not present

## 2018-10-31 DIAGNOSIS — C9200 Acute myeloblastic leukemia, not having achieved remission: Secondary | ICD-10-CM | POA: Diagnosis not present

## 2018-10-31 DIAGNOSIS — Z79899 Other long term (current) drug therapy: Secondary | ICD-10-CM | POA: Diagnosis not present

## 2018-10-31 DIAGNOSIS — R350 Frequency of micturition: Secondary | ICD-10-CM | POA: Diagnosis not present

## 2018-10-31 DIAGNOSIS — K5900 Constipation, unspecified: Secondary | ICD-10-CM | POA: Diagnosis not present

## 2018-10-31 DIAGNOSIS — Z95828 Presence of other vascular implants and grafts: Secondary | ICD-10-CM | POA: Diagnosis not present

## 2018-11-03 ENCOUNTER — Other Ambulatory Visit: Payer: Self-pay

## 2018-11-03 ENCOUNTER — Encounter: Payer: Medicare Other | Admitting: *Deleted

## 2018-11-03 DIAGNOSIS — N401 Enlarged prostate with lower urinary tract symptoms: Secondary | ICD-10-CM | POA: Diagnosis not present

## 2018-11-03 DIAGNOSIS — K648 Other hemorrhoids: Secondary | ICD-10-CM | POA: Diagnosis not present

## 2018-11-03 DIAGNOSIS — C9200 Acute myeloblastic leukemia, not having achieved remission: Secondary | ICD-10-CM | POA: Diagnosis not present

## 2018-11-03 DIAGNOSIS — R197 Diarrhea, unspecified: Secondary | ICD-10-CM | POA: Diagnosis not present

## 2018-11-03 DIAGNOSIS — K1230 Oral mucositis (ulcerative), unspecified: Secondary | ICD-10-CM | POA: Diagnosis not present

## 2018-11-04 ENCOUNTER — Telehealth: Payer: Self-pay

## 2018-11-04 DIAGNOSIS — Z7901 Long term (current) use of anticoagulants: Secondary | ICD-10-CM | POA: Diagnosis not present

## 2018-11-04 DIAGNOSIS — Z85820 Personal history of malignant melanoma of skin: Secondary | ICD-10-CM | POA: Diagnosis not present

## 2018-11-04 DIAGNOSIS — D709 Neutropenia, unspecified: Secondary | ICD-10-CM | POA: Diagnosis not present

## 2018-11-04 DIAGNOSIS — R39198 Other difficulties with micturition: Secondary | ICD-10-CM | POA: Diagnosis not present

## 2018-11-04 DIAGNOSIS — G4700 Insomnia, unspecified: Secondary | ICD-10-CM | POA: Diagnosis present

## 2018-11-04 DIAGNOSIS — K1230 Oral mucositis (ulcerative), unspecified: Secondary | ICD-10-CM | POA: Diagnosis present

## 2018-11-04 DIAGNOSIS — K5903 Drug induced constipation: Secondary | ICD-10-CM | POA: Diagnosis present

## 2018-11-04 DIAGNOSIS — R5081 Fever presenting with conditions classified elsewhere: Secondary | ICD-10-CM | POA: Diagnosis not present

## 2018-11-04 DIAGNOSIS — Z923 Personal history of irradiation: Secondary | ICD-10-CM | POA: Diagnosis not present

## 2018-11-04 DIAGNOSIS — J329 Chronic sinusitis, unspecified: Secondary | ICD-10-CM | POA: Diagnosis present

## 2018-11-04 DIAGNOSIS — Z5111 Encounter for antineoplastic chemotherapy: Secondary | ICD-10-CM | POA: Diagnosis not present

## 2018-11-04 DIAGNOSIS — N401 Enlarged prostate with lower urinary tract symptoms: Secondary | ICD-10-CM | POA: Diagnosis present

## 2018-11-04 DIAGNOSIS — D703 Neutropenia due to infection: Secondary | ICD-10-CM | POA: Diagnosis present

## 2018-11-04 DIAGNOSIS — R918 Other nonspecific abnormal finding of lung field: Secondary | ICD-10-CM | POA: Diagnosis not present

## 2018-11-04 DIAGNOSIS — K610 Anal abscess: Secondary | ICD-10-CM | POA: Diagnosis not present

## 2018-11-04 DIAGNOSIS — R350 Frequency of micturition: Secondary | ICD-10-CM | POA: Diagnosis present

## 2018-11-04 DIAGNOSIS — R110 Nausea: Secondary | ICD-10-CM | POA: Diagnosis not present

## 2018-11-04 DIAGNOSIS — K219 Gastro-esophageal reflux disease without esophagitis: Secondary | ICD-10-CM | POA: Diagnosis present

## 2018-11-04 DIAGNOSIS — K1370 Unspecified lesions of oral mucosa: Secondary | ICD-10-CM | POA: Diagnosis not present

## 2018-11-04 DIAGNOSIS — C9200 Acute myeloblastic leukemia, not having achieved remission: Secondary | ICD-10-CM | POA: Diagnosis present

## 2018-11-04 DIAGNOSIS — K5900 Constipation, unspecified: Secondary | ICD-10-CM | POA: Diagnosis not present

## 2018-11-04 DIAGNOSIS — D469 Myelodysplastic syndrome, unspecified: Secondary | ICD-10-CM | POA: Diagnosis not present

## 2018-11-04 DIAGNOSIS — Z8572 Personal history of non-Hodgkin lymphomas: Secondary | ICD-10-CM | POA: Diagnosis not present

## 2018-11-04 DIAGNOSIS — Z87448 Personal history of other diseases of urinary system: Secondary | ICD-10-CM | POA: Diagnosis not present

## 2018-11-04 DIAGNOSIS — K6289 Other specified diseases of anus and rectum: Secondary | ICD-10-CM | POA: Diagnosis present

## 2018-11-04 DIAGNOSIS — Z950 Presence of cardiac pacemaker: Secondary | ICD-10-CM | POA: Diagnosis not present

## 2018-11-04 DIAGNOSIS — R309 Painful micturition, unspecified: Secondary | ICD-10-CM | POA: Diagnosis not present

## 2018-11-04 DIAGNOSIS — J984 Other disorders of lung: Secondary | ICD-10-CM | POA: Diagnosis not present

## 2018-11-04 DIAGNOSIS — Z79899 Other long term (current) drug therapy: Secondary | ICD-10-CM | POA: Diagnosis not present

## 2018-11-04 DIAGNOSIS — I480 Paroxysmal atrial fibrillation: Secondary | ICD-10-CM | POA: Diagnosis present

## 2018-11-04 DIAGNOSIS — T451X5A Adverse effect of antineoplastic and immunosuppressive drugs, initial encounter: Secondary | ICD-10-CM | POA: Diagnosis present

## 2018-11-04 DIAGNOSIS — R509 Fever, unspecified: Secondary | ICD-10-CM | POA: Diagnosis not present

## 2018-11-04 DIAGNOSIS — D801 Nonfamilial hypogammaglobulinemia: Secondary | ICD-10-CM | POA: Diagnosis present

## 2018-11-04 DIAGNOSIS — R600 Localized edema: Secondary | ICD-10-CM | POA: Diagnosis not present

## 2018-11-04 DIAGNOSIS — E8770 Fluid overload, unspecified: Secondary | ICD-10-CM | POA: Diagnosis not present

## 2018-11-04 DIAGNOSIS — K120 Recurrent oral aphthae: Secondary | ICD-10-CM | POA: Diagnosis not present

## 2018-11-04 DIAGNOSIS — K649 Unspecified hemorrhoids: Secondary | ICD-10-CM | POA: Diagnosis present

## 2018-11-04 DIAGNOSIS — R609 Edema, unspecified: Secondary | ICD-10-CM | POA: Diagnosis not present

## 2018-11-04 DIAGNOSIS — D61810 Antineoplastic chemotherapy induced pancytopenia: Secondary | ICD-10-CM | POA: Diagnosis present

## 2018-11-04 NOTE — Telephone Encounter (Signed)
Left message for patient to remind of missed remote transmission.  

## 2018-11-17 ENCOUNTER — Other Ambulatory Visit: Payer: Self-pay

## 2018-11-17 ENCOUNTER — Ambulatory Visit (INDEPENDENT_AMBULATORY_CARE_PROVIDER_SITE_OTHER): Payer: Medicare Other | Admitting: *Deleted

## 2018-11-17 DIAGNOSIS — I4891 Unspecified atrial fibrillation: Secondary | ICD-10-CM

## 2018-11-17 DIAGNOSIS — I495 Sick sinus syndrome: Secondary | ICD-10-CM

## 2018-11-18 DIAGNOSIS — G4700 Insomnia, unspecified: Secondary | ICD-10-CM | POA: Diagnosis not present

## 2018-11-18 DIAGNOSIS — Z79899 Other long term (current) drug therapy: Secondary | ICD-10-CM | POA: Diagnosis not present

## 2018-11-18 DIAGNOSIS — R110 Nausea: Secondary | ICD-10-CM | POA: Diagnosis not present

## 2018-11-18 DIAGNOSIS — R609 Edema, unspecified: Secondary | ICD-10-CM | POA: Diagnosis not present

## 2018-11-18 DIAGNOSIS — I495 Sick sinus syndrome: Secondary | ICD-10-CM | POA: Diagnosis not present

## 2018-11-18 DIAGNOSIS — K5909 Other constipation: Secondary | ICD-10-CM | POA: Diagnosis not present

## 2018-11-18 DIAGNOSIS — C9200 Acute myeloblastic leukemia, not having achieved remission: Secondary | ICD-10-CM | POA: Diagnosis not present

## 2018-11-18 DIAGNOSIS — R140 Abdominal distension (gaseous): Secondary | ICD-10-CM | POA: Diagnosis not present

## 2018-11-18 DIAGNOSIS — R05 Cough: Secondary | ICD-10-CM | POA: Diagnosis not present

## 2018-11-18 DIAGNOSIS — Z95828 Presence of other vascular implants and grafts: Secondary | ICD-10-CM | POA: Diagnosis not present

## 2018-11-18 LAB — CUP PACEART REMOTE DEVICE CHECK
Battery Remaining Longevity: 30 mo
Battery Voltage: 2.94 V
Brady Statistic AP VP Percent: 86.54 %
Brady Statistic AP VS Percent: 0.07 %
Brady Statistic AS VP Percent: 13.18 %
Brady Statistic AS VS Percent: 0.21 %
Brady Statistic RA Percent Paced: 85.6 %
Brady Statistic RV Percent Paced: 99.71 %
Date Time Interrogation Session: 20200508150202
Implantable Lead Implant Date: 20071115
Implantable Lead Implant Date: 20071115
Implantable Lead Location: 753859
Implantable Lead Location: 753860
Implantable Lead Model: 5076
Implantable Lead Model: 5076
Implantable Pulse Generator Implant Date: 20150508
Lead Channel Impedance Value: 304 Ohm
Lead Channel Impedance Value: 342 Ohm
Lead Channel Impedance Value: 380 Ohm
Lead Channel Impedance Value: 399 Ohm
Lead Channel Pacing Threshold Amplitude: 0.75 V
Lead Channel Pacing Threshold Amplitude: 1.5 V
Lead Channel Pacing Threshold Pulse Width: 0.4 ms
Lead Channel Pacing Threshold Pulse Width: 0.4 ms
Lead Channel Sensing Intrinsic Amplitude: 1.375 mV
Lead Channel Sensing Intrinsic Amplitude: 1.375 mV
Lead Channel Sensing Intrinsic Amplitude: 5.875 mV
Lead Channel Sensing Intrinsic Amplitude: 5.875 mV
Lead Channel Setting Pacing Amplitude: 2 V
Lead Channel Setting Pacing Amplitude: 2.5 V
Lead Channel Setting Pacing Pulse Width: 0.7 ms
Lead Channel Setting Sensing Sensitivity: 2.8 mV

## 2018-11-21 DIAGNOSIS — C9200 Acute myeloblastic leukemia, not having achieved remission: Secondary | ICD-10-CM | POA: Diagnosis not present

## 2018-11-21 DIAGNOSIS — Z7901 Long term (current) use of anticoagulants: Secondary | ICD-10-CM | POA: Diagnosis not present

## 2018-11-21 DIAGNOSIS — G4700 Insomnia, unspecified: Secondary | ICD-10-CM | POA: Diagnosis not present

## 2018-11-21 DIAGNOSIS — R05 Cough: Secondary | ICD-10-CM | POA: Diagnosis not present

## 2018-11-21 DIAGNOSIS — I495 Sick sinus syndrome: Secondary | ICD-10-CM | POA: Diagnosis not present

## 2018-11-21 DIAGNOSIS — K5903 Drug induced constipation: Secondary | ICD-10-CM | POA: Diagnosis not present

## 2018-11-21 DIAGNOSIS — R609 Edema, unspecified: Secondary | ICD-10-CM | POA: Diagnosis not present

## 2018-11-21 DIAGNOSIS — Z950 Presence of cardiac pacemaker: Secondary | ICD-10-CM | POA: Diagnosis not present

## 2018-11-21 DIAGNOSIS — K611 Rectal abscess: Secondary | ICD-10-CM | POA: Diagnosis not present

## 2018-11-21 DIAGNOSIS — L209 Atopic dermatitis, unspecified: Secondary | ICD-10-CM | POA: Diagnosis not present

## 2018-11-21 DIAGNOSIS — Z792 Long term (current) use of antibiotics: Secondary | ICD-10-CM | POA: Diagnosis not present

## 2018-11-21 DIAGNOSIS — R140 Abdominal distension (gaseous): Secondary | ICD-10-CM | POA: Diagnosis not present

## 2018-11-21 DIAGNOSIS — J90 Pleural effusion, not elsewhere classified: Secondary | ICD-10-CM | POA: Diagnosis not present

## 2018-11-21 DIAGNOSIS — D469 Myelodysplastic syndrome, unspecified: Secondary | ICD-10-CM | POA: Diagnosis not present

## 2018-11-21 DIAGNOSIS — Z79899 Other long term (current) drug therapy: Secondary | ICD-10-CM | POA: Diagnosis not present

## 2018-11-21 DIAGNOSIS — R110 Nausea: Secondary | ICD-10-CM | POA: Diagnosis not present

## 2018-11-21 DIAGNOSIS — R5383 Other fatigue: Secondary | ICD-10-CM | POA: Diagnosis not present

## 2018-11-21 DIAGNOSIS — Z8572 Personal history of non-Hodgkin lymphomas: Secondary | ICD-10-CM | POA: Diagnosis not present

## 2018-11-21 DIAGNOSIS — I480 Paroxysmal atrial fibrillation: Secondary | ICD-10-CM | POA: Diagnosis not present

## 2018-11-21 DIAGNOSIS — J329 Chronic sinusitis, unspecified: Secondary | ICD-10-CM | POA: Diagnosis not present

## 2018-11-21 DIAGNOSIS — T451X5A Adverse effect of antineoplastic and immunosuppressive drugs, initial encounter: Secondary | ICD-10-CM | POA: Diagnosis not present

## 2018-11-25 DIAGNOSIS — Z79899 Other long term (current) drug therapy: Secondary | ICD-10-CM | POA: Diagnosis not present

## 2018-11-25 DIAGNOSIS — I495 Sick sinus syndrome: Secondary | ICD-10-CM | POA: Diagnosis not present

## 2018-11-25 DIAGNOSIS — J3489 Other specified disorders of nose and nasal sinuses: Secondary | ICD-10-CM | POA: Diagnosis not present

## 2018-11-25 DIAGNOSIS — R110 Nausea: Secondary | ICD-10-CM | POA: Diagnosis not present

## 2018-11-25 DIAGNOSIS — R609 Edema, unspecified: Secondary | ICD-10-CM | POA: Diagnosis not present

## 2018-11-25 DIAGNOSIS — Z95828 Presence of other vascular implants and grafts: Secondary | ICD-10-CM | POA: Diagnosis not present

## 2018-11-25 DIAGNOSIS — K5903 Drug induced constipation: Secondary | ICD-10-CM | POA: Diagnosis not present

## 2018-11-25 DIAGNOSIS — D469 Myelodysplastic syndrome, unspecified: Secondary | ICD-10-CM | POA: Diagnosis not present

## 2018-11-25 DIAGNOSIS — D709 Neutropenia, unspecified: Secondary | ICD-10-CM | POA: Diagnosis not present

## 2018-11-25 DIAGNOSIS — K056 Periodontal disease, unspecified: Secondary | ICD-10-CM | POA: Diagnosis not present

## 2018-11-25 DIAGNOSIS — I480 Paroxysmal atrial fibrillation: Secondary | ICD-10-CM | POA: Diagnosis not present

## 2018-11-25 DIAGNOSIS — K611 Rectal abscess: Secondary | ICD-10-CM | POA: Diagnosis not present

## 2018-11-25 DIAGNOSIS — R140 Abdominal distension (gaseous): Secondary | ICD-10-CM | POA: Diagnosis not present

## 2018-11-25 DIAGNOSIS — C9200 Acute myeloblastic leukemia, not having achieved remission: Secondary | ICD-10-CM | POA: Diagnosis not present

## 2018-11-25 DIAGNOSIS — R911 Solitary pulmonary nodule: Secondary | ICD-10-CM | POA: Diagnosis not present

## 2018-11-25 DIAGNOSIS — G4700 Insomnia, unspecified: Secondary | ICD-10-CM | POA: Diagnosis not present

## 2018-11-25 DIAGNOSIS — J40 Bronchitis, not specified as acute or chronic: Secondary | ICD-10-CM | POA: Diagnosis not present

## 2018-11-25 DIAGNOSIS — Z8572 Personal history of non-Hodgkin lymphomas: Secondary | ICD-10-CM | POA: Diagnosis not present

## 2018-11-25 DIAGNOSIS — Z792 Long term (current) use of antibiotics: Secondary | ICD-10-CM | POA: Diagnosis not present

## 2018-11-25 DIAGNOSIS — J90 Pleural effusion, not elsewhere classified: Secondary | ICD-10-CM | POA: Diagnosis not present

## 2018-11-25 DIAGNOSIS — L209 Atopic dermatitis, unspecified: Secondary | ICD-10-CM | POA: Diagnosis not present

## 2018-11-25 DIAGNOSIS — Z950 Presence of cardiac pacemaker: Secondary | ICD-10-CM | POA: Diagnosis not present

## 2018-11-25 DIAGNOSIS — R0602 Shortness of breath: Secondary | ICD-10-CM | POA: Diagnosis not present

## 2018-11-25 DIAGNOSIS — R05 Cough: Secondary | ICD-10-CM | POA: Diagnosis not present

## 2018-11-26 NOTE — Progress Notes (Signed)
Remote pacemaker transmission.   

## 2018-11-27 DIAGNOSIS — Z9221 Personal history of antineoplastic chemotherapy: Secondary | ICD-10-CM | POA: Diagnosis not present

## 2018-11-27 DIAGNOSIS — C9200 Acute myeloblastic leukemia, not having achieved remission: Secondary | ICD-10-CM | POA: Diagnosis not present

## 2018-11-27 DIAGNOSIS — R0602 Shortness of breath: Secondary | ICD-10-CM | POA: Diagnosis not present

## 2018-11-27 DIAGNOSIS — R05 Cough: Secondary | ICD-10-CM | POA: Diagnosis not present

## 2018-11-27 DIAGNOSIS — J470 Bronchiectasis with acute lower respiratory infection: Secondary | ICD-10-CM | POA: Diagnosis not present

## 2018-11-27 DIAGNOSIS — B9689 Other specified bacterial agents as the cause of diseases classified elsewhere: Secondary | ICD-10-CM | POA: Diagnosis not present

## 2018-11-27 DIAGNOSIS — D709 Neutropenia, unspecified: Secondary | ICD-10-CM | POA: Diagnosis not present

## 2018-11-28 DIAGNOSIS — J329 Chronic sinusitis, unspecified: Secondary | ICD-10-CM | POA: Diagnosis not present

## 2018-11-28 DIAGNOSIS — I480 Paroxysmal atrial fibrillation: Secondary | ICD-10-CM | POA: Diagnosis not present

## 2018-11-28 DIAGNOSIS — Z87898 Personal history of other specified conditions: Secondary | ICD-10-CM | POA: Diagnosis not present

## 2018-11-28 DIAGNOSIS — R110 Nausea: Secondary | ICD-10-CM | POA: Diagnosis not present

## 2018-11-28 DIAGNOSIS — Z950 Presence of cardiac pacemaker: Secondary | ICD-10-CM | POA: Diagnosis not present

## 2018-11-28 DIAGNOSIS — G4700 Insomnia, unspecified: Secondary | ICD-10-CM | POA: Diagnosis not present

## 2018-11-28 DIAGNOSIS — Z792 Long term (current) use of antibiotics: Secondary | ICD-10-CM | POA: Diagnosis not present

## 2018-11-28 DIAGNOSIS — K611 Rectal abscess: Secondary | ICD-10-CM | POA: Diagnosis not present

## 2018-11-28 DIAGNOSIS — Z7901 Long term (current) use of anticoagulants: Secondary | ICD-10-CM | POA: Diagnosis not present

## 2018-11-28 DIAGNOSIS — Z79899 Other long term (current) drug therapy: Secondary | ICD-10-CM | POA: Diagnosis not present

## 2018-11-28 DIAGNOSIS — D469 Myelodysplastic syndrome, unspecified: Secondary | ICD-10-CM | POA: Diagnosis not present

## 2018-11-28 DIAGNOSIS — D696 Thrombocytopenia, unspecified: Secondary | ICD-10-CM | POA: Diagnosis not present

## 2018-11-28 DIAGNOSIS — Z8572 Personal history of non-Hodgkin lymphomas: Secondary | ICD-10-CM | POA: Diagnosis not present

## 2018-11-28 DIAGNOSIS — D709 Neutropenia, unspecified: Secondary | ICD-10-CM | POA: Diagnosis not present

## 2018-11-28 DIAGNOSIS — C9200 Acute myeloblastic leukemia, not having achieved remission: Secondary | ICD-10-CM | POA: Diagnosis not present

## 2018-11-28 DIAGNOSIS — I495 Sick sinus syndrome: Secondary | ICD-10-CM | POA: Diagnosis not present

## 2018-11-28 DIAGNOSIS — Z95828 Presence of other vascular implants and grafts: Secondary | ICD-10-CM | POA: Diagnosis not present

## 2018-11-28 DIAGNOSIS — K5903 Drug induced constipation: Secondary | ICD-10-CM | POA: Diagnosis not present

## 2018-11-28 DIAGNOSIS — L2089 Other atopic dermatitis: Secondary | ICD-10-CM | POA: Diagnosis not present

## 2018-11-28 DIAGNOSIS — R5383 Other fatigue: Secondary | ICD-10-CM | POA: Diagnosis not present

## 2018-11-28 DIAGNOSIS — D61818 Other pancytopenia: Secondary | ICD-10-CM | POA: Diagnosis not present

## 2018-11-28 DIAGNOSIS — R609 Edema, unspecified: Secondary | ICD-10-CM | POA: Diagnosis not present

## 2018-11-28 DIAGNOSIS — T451X5A Adverse effect of antineoplastic and immunosuppressive drugs, initial encounter: Secondary | ICD-10-CM | POA: Diagnosis not present

## 2018-12-02 DIAGNOSIS — R911 Solitary pulmonary nodule: Secondary | ICD-10-CM | POA: Diagnosis not present

## 2018-12-02 DIAGNOSIS — J329 Chronic sinusitis, unspecified: Secondary | ICD-10-CM | POA: Diagnosis not present

## 2018-12-02 DIAGNOSIS — Z950 Presence of cardiac pacemaker: Secondary | ICD-10-CM | POA: Diagnosis not present

## 2018-12-02 DIAGNOSIS — K5903 Drug induced constipation: Secondary | ICD-10-CM | POA: Diagnosis not present

## 2018-12-02 DIAGNOSIS — R600 Localized edema: Secondary | ICD-10-CM | POA: Diagnosis not present

## 2018-12-02 DIAGNOSIS — T451X5A Adverse effect of antineoplastic and immunosuppressive drugs, initial encounter: Secondary | ICD-10-CM | POA: Diagnosis not present

## 2018-12-02 DIAGNOSIS — K611 Rectal abscess: Secondary | ICD-10-CM | POA: Diagnosis not present

## 2018-12-02 DIAGNOSIS — C9200 Acute myeloblastic leukemia, not having achieved remission: Secondary | ICD-10-CM | POA: Diagnosis not present

## 2018-12-02 DIAGNOSIS — R140 Abdominal distension (gaseous): Secondary | ICD-10-CM | POA: Diagnosis not present

## 2018-12-02 DIAGNOSIS — I495 Sick sinus syndrome: Secondary | ICD-10-CM | POA: Diagnosis not present

## 2018-12-02 DIAGNOSIS — R609 Edema, unspecified: Secondary | ICD-10-CM | POA: Diagnosis not present

## 2018-12-02 DIAGNOSIS — Z79899 Other long term (current) drug therapy: Secondary | ICD-10-CM | POA: Diagnosis not present

## 2018-12-02 DIAGNOSIS — R05 Cough: Secondary | ICD-10-CM | POA: Diagnosis not present

## 2018-12-02 DIAGNOSIS — Z8572 Personal history of non-Hodgkin lymphomas: Secondary | ICD-10-CM | POA: Diagnosis not present

## 2018-12-02 DIAGNOSIS — G4700 Insomnia, unspecified: Secondary | ICD-10-CM | POA: Diagnosis not present

## 2018-12-02 DIAGNOSIS — I480 Paroxysmal atrial fibrillation: Secondary | ICD-10-CM | POA: Diagnosis not present

## 2018-12-02 DIAGNOSIS — L308 Other specified dermatitis: Secondary | ICD-10-CM | POA: Diagnosis not present

## 2018-12-02 DIAGNOSIS — Z792 Long term (current) use of antibiotics: Secondary | ICD-10-CM | POA: Diagnosis not present

## 2018-12-02 DIAGNOSIS — D469 Myelodysplastic syndrome, unspecified: Secondary | ICD-10-CM | POA: Diagnosis not present

## 2018-12-04 DIAGNOSIS — R911 Solitary pulmonary nodule: Secondary | ICD-10-CM | POA: Diagnosis not present

## 2018-12-04 DIAGNOSIS — J189 Pneumonia, unspecified organism: Secondary | ICD-10-CM | POA: Diagnosis not present

## 2018-12-04 DIAGNOSIS — Z79899 Other long term (current) drug therapy: Secondary | ICD-10-CM | POA: Diagnosis not present

## 2018-12-04 DIAGNOSIS — I313 Pericardial effusion (noninflammatory): Secondary | ICD-10-CM | POA: Diagnosis not present

## 2018-12-04 DIAGNOSIS — C9200 Acute myeloblastic leukemia, not having achieved remission: Secondary | ICD-10-CM | POA: Diagnosis not present

## 2018-12-04 DIAGNOSIS — J181 Lobar pneumonia, unspecified organism: Secondary | ICD-10-CM | POA: Diagnosis not present

## 2018-12-04 DIAGNOSIS — R918 Other nonspecific abnormal finding of lung field: Secondary | ICD-10-CM | POA: Diagnosis not present

## 2018-12-05 DIAGNOSIS — K5903 Drug induced constipation: Secondary | ICD-10-CM | POA: Diagnosis not present

## 2018-12-05 DIAGNOSIS — Z1159 Encounter for screening for other viral diseases: Secondary | ICD-10-CM | POA: Diagnosis not present

## 2018-12-05 DIAGNOSIS — Z95828 Presence of other vascular implants and grafts: Secondary | ICD-10-CM | POA: Diagnosis not present

## 2018-12-05 DIAGNOSIS — G4700 Insomnia, unspecified: Secondary | ICD-10-CM | POA: Diagnosis not present

## 2018-12-05 DIAGNOSIS — L308 Other specified dermatitis: Secondary | ICD-10-CM | POA: Diagnosis not present

## 2018-12-05 DIAGNOSIS — C9200 Acute myeloblastic leukemia, not having achieved remission: Secondary | ICD-10-CM | POA: Diagnosis not present

## 2018-12-09 DIAGNOSIS — K5903 Drug induced constipation: Secondary | ICD-10-CM | POA: Diagnosis not present

## 2018-12-09 DIAGNOSIS — R5383 Other fatigue: Secondary | ICD-10-CM | POA: Diagnosis not present

## 2018-12-09 DIAGNOSIS — D61818 Other pancytopenia: Secondary | ICD-10-CM | POA: Diagnosis not present

## 2018-12-09 DIAGNOSIS — M25532 Pain in left wrist: Secondary | ICD-10-CM | POA: Diagnosis not present

## 2018-12-09 DIAGNOSIS — Z7901 Long term (current) use of anticoagulants: Secondary | ICD-10-CM | POA: Diagnosis not present

## 2018-12-09 DIAGNOSIS — Z950 Presence of cardiac pacemaker: Secondary | ICD-10-CM | POA: Diagnosis not present

## 2018-12-09 DIAGNOSIS — G4700 Insomnia, unspecified: Secondary | ICD-10-CM | POA: Diagnosis not present

## 2018-12-09 DIAGNOSIS — M25432 Effusion, left wrist: Secondary | ICD-10-CM | POA: Diagnosis not present

## 2018-12-09 DIAGNOSIS — Z95828 Presence of other vascular implants and grafts: Secondary | ICD-10-CM | POA: Diagnosis not present

## 2018-12-09 DIAGNOSIS — C9200 Acute myeloblastic leukemia, not having achieved remission: Secondary | ICD-10-CM | POA: Diagnosis not present

## 2018-12-09 DIAGNOSIS — Z79899 Other long term (current) drug therapy: Secondary | ICD-10-CM | POA: Diagnosis not present

## 2018-12-09 DIAGNOSIS — D696 Thrombocytopenia, unspecified: Secondary | ICD-10-CM | POA: Diagnosis not present

## 2018-12-10 DIAGNOSIS — R918 Other nonspecific abnormal finding of lung field: Secondary | ICD-10-CM | POA: Diagnosis not present

## 2018-12-10 DIAGNOSIS — T17990A Other foreign object in respiratory tract, part unspecified in causing asphyxiation, initial encounter: Secondary | ICD-10-CM | POA: Diagnosis not present

## 2018-12-10 DIAGNOSIS — R05 Cough: Secondary | ICD-10-CM | POA: Diagnosis not present

## 2018-12-10 DIAGNOSIS — J479 Bronchiectasis, uncomplicated: Secondary | ICD-10-CM | POA: Diagnosis not present

## 2018-12-10 DIAGNOSIS — J209 Acute bronchitis, unspecified: Secondary | ICD-10-CM | POA: Diagnosis not present

## 2018-12-16 DIAGNOSIS — Z79899 Other long term (current) drug therapy: Secondary | ICD-10-CM | POA: Diagnosis not present

## 2018-12-16 DIAGNOSIS — D696 Thrombocytopenia, unspecified: Secondary | ICD-10-CM | POA: Diagnosis not present

## 2018-12-16 DIAGNOSIS — Z950 Presence of cardiac pacemaker: Secondary | ICD-10-CM | POA: Diagnosis not present

## 2018-12-16 DIAGNOSIS — D469 Myelodysplastic syndrome, unspecified: Secondary | ICD-10-CM | POA: Diagnosis not present

## 2018-12-16 DIAGNOSIS — M25532 Pain in left wrist: Secondary | ICD-10-CM | POA: Diagnosis not present

## 2018-12-16 DIAGNOSIS — G4700 Insomnia, unspecified: Secondary | ICD-10-CM | POA: Diagnosis not present

## 2018-12-16 DIAGNOSIS — L308 Other specified dermatitis: Secondary | ICD-10-CM | POA: Diagnosis not present

## 2018-12-16 DIAGNOSIS — Z8679 Personal history of other diseases of the circulatory system: Secondary | ICD-10-CM | POA: Diagnosis not present

## 2018-12-16 DIAGNOSIS — R911 Solitary pulmonary nodule: Secondary | ICD-10-CM | POA: Diagnosis not present

## 2018-12-16 DIAGNOSIS — K5903 Drug induced constipation: Secondary | ICD-10-CM | POA: Diagnosis not present

## 2018-12-16 DIAGNOSIS — Z9221 Personal history of antineoplastic chemotherapy: Secondary | ICD-10-CM | POA: Diagnosis not present

## 2018-12-16 DIAGNOSIS — T451X5A Adverse effect of antineoplastic and immunosuppressive drugs, initial encounter: Secondary | ICD-10-CM | POA: Diagnosis not present

## 2018-12-16 DIAGNOSIS — J328 Other chronic sinusitis: Secondary | ICD-10-CM | POA: Diagnosis not present

## 2018-12-16 DIAGNOSIS — Z95828 Presence of other vascular implants and grafts: Secondary | ICD-10-CM | POA: Diagnosis not present

## 2018-12-16 DIAGNOSIS — I480 Paroxysmal atrial fibrillation: Secondary | ICD-10-CM | POA: Diagnosis not present

## 2018-12-16 DIAGNOSIS — C9200 Acute myeloblastic leukemia, not having achieved remission: Secondary | ICD-10-CM | POA: Diagnosis not present

## 2018-12-16 DIAGNOSIS — Z792 Long term (current) use of antibiotics: Secondary | ICD-10-CM | POA: Diagnosis not present

## 2018-12-16 DIAGNOSIS — Z8572 Personal history of non-Hodgkin lymphomas: Secondary | ICD-10-CM | POA: Diagnosis not present

## 2018-12-18 DIAGNOSIS — C9201 Acute myeloblastic leukemia, in remission: Secondary | ICD-10-CM | POA: Diagnosis not present

## 2018-12-18 DIAGNOSIS — Z79899 Other long term (current) drug therapy: Secondary | ICD-10-CM | POA: Diagnosis not present

## 2018-12-18 DIAGNOSIS — B449 Aspergillosis, unspecified: Secondary | ICD-10-CM | POA: Diagnosis not present

## 2018-12-18 DIAGNOSIS — B348 Other viral infections of unspecified site: Secondary | ICD-10-CM | POA: Diagnosis not present

## 2018-12-18 DIAGNOSIS — J189 Pneumonia, unspecified organism: Secondary | ICD-10-CM | POA: Diagnosis not present

## 2018-12-19 DIAGNOSIS — K5903 Drug induced constipation: Secondary | ICD-10-CM | POA: Diagnosis not present

## 2018-12-19 DIAGNOSIS — R110 Nausea: Secondary | ICD-10-CM | POA: Diagnosis not present

## 2018-12-19 DIAGNOSIS — I495 Sick sinus syndrome: Secondary | ICD-10-CM | POA: Diagnosis not present

## 2018-12-19 DIAGNOSIS — Z95828 Presence of other vascular implants and grafts: Secondary | ICD-10-CM | POA: Diagnosis not present

## 2018-12-19 DIAGNOSIS — C9200 Acute myeloblastic leukemia, not having achieved remission: Secondary | ICD-10-CM | POA: Diagnosis not present

## 2018-12-19 DIAGNOSIS — Z950 Presence of cardiac pacemaker: Secondary | ICD-10-CM | POA: Diagnosis not present

## 2018-12-19 DIAGNOSIS — G4700 Insomnia, unspecified: Secondary | ICD-10-CM | POA: Diagnosis not present

## 2018-12-19 DIAGNOSIS — T451X5A Adverse effect of antineoplastic and immunosuppressive drugs, initial encounter: Secondary | ICD-10-CM | POA: Diagnosis not present

## 2018-12-19 DIAGNOSIS — M25532 Pain in left wrist: Secondary | ICD-10-CM | POA: Diagnosis not present

## 2018-12-19 DIAGNOSIS — D469 Myelodysplastic syndrome, unspecified: Secondary | ICD-10-CM | POA: Diagnosis not present

## 2018-12-19 DIAGNOSIS — J328 Other chronic sinusitis: Secondary | ICD-10-CM | POA: Diagnosis not present

## 2018-12-19 DIAGNOSIS — I480 Paroxysmal atrial fibrillation: Secondary | ICD-10-CM | POA: Diagnosis not present

## 2018-12-19 DIAGNOSIS — R609 Edema, unspecified: Secondary | ICD-10-CM | POA: Diagnosis not present

## 2018-12-19 DIAGNOSIS — Z87898 Personal history of other specified conditions: Secondary | ICD-10-CM | POA: Diagnosis not present

## 2018-12-19 DIAGNOSIS — Z9889 Other specified postprocedural states: Secondary | ICD-10-CM | POA: Diagnosis not present

## 2018-12-19 DIAGNOSIS — L308 Other specified dermatitis: Secondary | ICD-10-CM | POA: Diagnosis not present

## 2018-12-19 DIAGNOSIS — Z79899 Other long term (current) drug therapy: Secondary | ICD-10-CM | POA: Diagnosis not present

## 2018-12-19 DIAGNOSIS — D696 Thrombocytopenia, unspecified: Secondary | ICD-10-CM | POA: Diagnosis not present

## 2018-12-23 DIAGNOSIS — K5903 Drug induced constipation: Secondary | ICD-10-CM | POA: Diagnosis not present

## 2018-12-23 DIAGNOSIS — C9200 Acute myeloblastic leukemia, not having achieved remission: Secondary | ICD-10-CM | POA: Diagnosis not present

## 2018-12-23 DIAGNOSIS — T451X5A Adverse effect of antineoplastic and immunosuppressive drugs, initial encounter: Secondary | ICD-10-CM | POA: Diagnosis not present

## 2018-12-23 DIAGNOSIS — Z950 Presence of cardiac pacemaker: Secondary | ICD-10-CM | POA: Diagnosis not present

## 2018-12-23 DIAGNOSIS — R911 Solitary pulmonary nodule: Secondary | ICD-10-CM | POA: Diagnosis not present

## 2018-12-23 DIAGNOSIS — D469 Myelodysplastic syndrome, unspecified: Secondary | ICD-10-CM | POA: Diagnosis not present

## 2018-12-23 DIAGNOSIS — M25532 Pain in left wrist: Secondary | ICD-10-CM | POA: Diagnosis not present

## 2018-12-23 DIAGNOSIS — J329 Chronic sinusitis, unspecified: Secondary | ICD-10-CM | POA: Diagnosis not present

## 2018-12-23 DIAGNOSIS — I495 Sick sinus syndrome: Secondary | ICD-10-CM | POA: Diagnosis not present

## 2018-12-23 DIAGNOSIS — R110 Nausea: Secondary | ICD-10-CM | POA: Diagnosis not present

## 2018-12-23 DIAGNOSIS — L209 Atopic dermatitis, unspecified: Secondary | ICD-10-CM | POA: Diagnosis not present

## 2018-12-23 DIAGNOSIS — Z8572 Personal history of non-Hodgkin lymphomas: Secondary | ICD-10-CM | POA: Diagnosis not present

## 2018-12-23 DIAGNOSIS — I480 Paroxysmal atrial fibrillation: Secondary | ICD-10-CM | POA: Diagnosis not present

## 2018-12-23 DIAGNOSIS — Z95828 Presence of other vascular implants and grafts: Secondary | ICD-10-CM | POA: Diagnosis not present

## 2018-12-26 DIAGNOSIS — D469 Myelodysplastic syndrome, unspecified: Secondary | ICD-10-CM | POA: Diagnosis not present

## 2018-12-26 DIAGNOSIS — C9200 Acute myeloblastic leukemia, not having achieved remission: Secondary | ICD-10-CM | POA: Diagnosis not present

## 2018-12-30 DIAGNOSIS — Z79899 Other long term (current) drug therapy: Secondary | ICD-10-CM | POA: Diagnosis not present

## 2018-12-30 DIAGNOSIS — Z859 Personal history of malignant neoplasm, unspecified: Secondary | ICD-10-CM | POA: Diagnosis not present

## 2018-12-30 DIAGNOSIS — Z95828 Presence of other vascular implants and grafts: Secondary | ICD-10-CM | POA: Diagnosis not present

## 2018-12-30 DIAGNOSIS — C9200 Acute myeloblastic leukemia, not having achieved remission: Secondary | ICD-10-CM | POA: Diagnosis not present

## 2018-12-30 DIAGNOSIS — G4700 Insomnia, unspecified: Secondary | ICD-10-CM | POA: Diagnosis not present

## 2018-12-30 DIAGNOSIS — Z950 Presence of cardiac pacemaker: Secondary | ICD-10-CM | POA: Diagnosis not present

## 2018-12-30 DIAGNOSIS — I480 Paroxysmal atrial fibrillation: Secondary | ICD-10-CM | POA: Diagnosis not present

## 2018-12-30 DIAGNOSIS — D469 Myelodysplastic syndrome, unspecified: Secondary | ICD-10-CM | POA: Diagnosis not present

## 2018-12-30 DIAGNOSIS — Z959 Presence of cardiac and vascular implant and graft, unspecified: Secondary | ICD-10-CM | POA: Diagnosis not present

## 2018-12-30 DIAGNOSIS — R911 Solitary pulmonary nodule: Secondary | ICD-10-CM | POA: Diagnosis not present

## 2018-12-30 DIAGNOSIS — D61818 Other pancytopenia: Secondary | ICD-10-CM | POA: Diagnosis not present

## 2018-12-30 DIAGNOSIS — J329 Chronic sinusitis, unspecified: Secondary | ICD-10-CM | POA: Diagnosis not present

## 2018-12-30 DIAGNOSIS — Z8572 Personal history of non-Hodgkin lymphomas: Secondary | ICD-10-CM | POA: Diagnosis not present

## 2018-12-30 DIAGNOSIS — I495 Sick sinus syndrome: Secondary | ICD-10-CM | POA: Diagnosis not present

## 2018-12-30 DIAGNOSIS — T451X5A Adverse effect of antineoplastic and immunosuppressive drugs, initial encounter: Secondary | ICD-10-CM | POA: Diagnosis not present

## 2018-12-30 DIAGNOSIS — K5903 Drug induced constipation: Secondary | ICD-10-CM | POA: Diagnosis not present

## 2018-12-30 DIAGNOSIS — L209 Atopic dermatitis, unspecified: Secondary | ICD-10-CM | POA: Diagnosis not present

## 2019-01-06 DIAGNOSIS — L308 Other specified dermatitis: Secondary | ICD-10-CM | POA: Diagnosis not present

## 2019-01-06 DIAGNOSIS — C9200 Acute myeloblastic leukemia, not having achieved remission: Secondary | ICD-10-CM | POA: Diagnosis not present

## 2019-01-06 DIAGNOSIS — Z950 Presence of cardiac pacemaker: Secondary | ICD-10-CM | POA: Diagnosis not present

## 2019-01-06 DIAGNOSIS — Z95828 Presence of other vascular implants and grafts: Secondary | ICD-10-CM | POA: Diagnosis not present

## 2019-01-06 DIAGNOSIS — K5903 Drug induced constipation: Secondary | ICD-10-CM | POA: Diagnosis not present

## 2019-01-06 DIAGNOSIS — G4700 Insomnia, unspecified: Secondary | ICD-10-CM | POA: Diagnosis not present

## 2019-01-06 DIAGNOSIS — D469 Myelodysplastic syndrome, unspecified: Secondary | ICD-10-CM | POA: Diagnosis not present

## 2019-01-08 DIAGNOSIS — J339 Nasal polyp, unspecified: Secondary | ICD-10-CM | POA: Diagnosis not present

## 2019-01-08 DIAGNOSIS — R490 Dysphonia: Secondary | ICD-10-CM | POA: Diagnosis not present

## 2019-01-08 DIAGNOSIS — H9193 Unspecified hearing loss, bilateral: Secondary | ICD-10-CM | POA: Diagnosis not present

## 2019-01-08 DIAGNOSIS — R0982 Postnasal drip: Secondary | ICD-10-CM | POA: Diagnosis not present

## 2019-01-08 DIAGNOSIS — J324 Chronic pansinusitis: Secondary | ICD-10-CM | POA: Diagnosis not present

## 2019-01-13 DIAGNOSIS — K5900 Constipation, unspecified: Secondary | ICD-10-CM | POA: Diagnosis not present

## 2019-01-13 DIAGNOSIS — R05 Cough: Secondary | ICD-10-CM | POA: Diagnosis not present

## 2019-01-13 DIAGNOSIS — Z79899 Other long term (current) drug therapy: Secondary | ICD-10-CM | POA: Diagnosis not present

## 2019-01-13 DIAGNOSIS — G4700 Insomnia, unspecified: Secondary | ICD-10-CM | POA: Diagnosis not present

## 2019-01-13 DIAGNOSIS — D696 Thrombocytopenia, unspecified: Secondary | ICD-10-CM | POA: Diagnosis not present

## 2019-01-13 DIAGNOSIS — R110 Nausea: Secondary | ICD-10-CM | POA: Diagnosis not present

## 2019-01-13 DIAGNOSIS — D469 Myelodysplastic syndrome, unspecified: Secondary | ICD-10-CM | POA: Diagnosis not present

## 2019-01-13 DIAGNOSIS — R911 Solitary pulmonary nodule: Secondary | ICD-10-CM | POA: Diagnosis not present

## 2019-01-13 DIAGNOSIS — R609 Edema, unspecified: Secondary | ICD-10-CM | POA: Diagnosis not present

## 2019-01-13 DIAGNOSIS — Z8572 Personal history of non-Hodgkin lymphomas: Secondary | ICD-10-CM | POA: Diagnosis not present

## 2019-01-13 DIAGNOSIS — T451X5A Adverse effect of antineoplastic and immunosuppressive drugs, initial encounter: Secondary | ICD-10-CM | POA: Diagnosis not present

## 2019-01-13 DIAGNOSIS — L308 Other specified dermatitis: Secondary | ICD-10-CM | POA: Diagnosis not present

## 2019-01-13 DIAGNOSIS — C9200 Acute myeloblastic leukemia, not having achieved remission: Secondary | ICD-10-CM | POA: Diagnosis not present

## 2019-01-13 DIAGNOSIS — R5383 Other fatigue: Secondary | ICD-10-CM | POA: Diagnosis not present

## 2019-01-13 DIAGNOSIS — E611 Iron deficiency: Secondary | ICD-10-CM | POA: Diagnosis not present

## 2019-01-13 DIAGNOSIS — I480 Paroxysmal atrial fibrillation: Secondary | ICD-10-CM | POA: Diagnosis not present

## 2019-01-13 DIAGNOSIS — J329 Chronic sinusitis, unspecified: Secondary | ICD-10-CM | POA: Diagnosis not present

## 2019-01-13 DIAGNOSIS — I495 Sick sinus syndrome: Secondary | ICD-10-CM | POA: Diagnosis not present

## 2019-01-13 DIAGNOSIS — Z950 Presence of cardiac pacemaker: Secondary | ICD-10-CM | POA: Diagnosis not present

## 2019-01-13 DIAGNOSIS — Z7901 Long term (current) use of anticoagulants: Secondary | ICD-10-CM | POA: Diagnosis not present

## 2019-01-13 DIAGNOSIS — K5903 Drug induced constipation: Secondary | ICD-10-CM | POA: Diagnosis not present

## 2019-01-13 DIAGNOSIS — Z95828 Presence of other vascular implants and grafts: Secondary | ICD-10-CM | POA: Diagnosis not present

## 2019-01-20 DIAGNOSIS — T451X5A Adverse effect of antineoplastic and immunosuppressive drugs, initial encounter: Secondary | ICD-10-CM | POA: Diagnosis not present

## 2019-01-20 DIAGNOSIS — G4700 Insomnia, unspecified: Secondary | ICD-10-CM | POA: Diagnosis not present

## 2019-01-20 DIAGNOSIS — R197 Diarrhea, unspecified: Secondary | ICD-10-CM | POA: Diagnosis not present

## 2019-01-20 DIAGNOSIS — L2089 Other atopic dermatitis: Secondary | ICD-10-CM | POA: Diagnosis not present

## 2019-01-20 DIAGNOSIS — Z95828 Presence of other vascular implants and grafts: Secondary | ICD-10-CM | POA: Diagnosis not present

## 2019-01-20 DIAGNOSIS — C9200 Acute myeloblastic leukemia, not having achieved remission: Secondary | ICD-10-CM | POA: Diagnosis not present

## 2019-01-20 DIAGNOSIS — K5903 Drug induced constipation: Secondary | ICD-10-CM | POA: Diagnosis not present

## 2019-01-20 DIAGNOSIS — L309 Dermatitis, unspecified: Secondary | ICD-10-CM | POA: Diagnosis not present

## 2019-01-20 DIAGNOSIS — J328 Other chronic sinusitis: Secondary | ICD-10-CM | POA: Diagnosis not present

## 2019-01-20 DIAGNOSIS — R630 Anorexia: Secondary | ICD-10-CM | POA: Diagnosis not present

## 2019-01-20 DIAGNOSIS — D61818 Other pancytopenia: Secondary | ICD-10-CM | POA: Diagnosis not present

## 2019-01-20 DIAGNOSIS — Z950 Presence of cardiac pacemaker: Secondary | ICD-10-CM | POA: Diagnosis not present

## 2019-01-20 DIAGNOSIS — Z79899 Other long term (current) drug therapy: Secondary | ICD-10-CM | POA: Diagnosis not present

## 2019-01-20 DIAGNOSIS — D469 Myelodysplastic syndrome, unspecified: Secondary | ICD-10-CM | POA: Diagnosis not present

## 2019-01-20 DIAGNOSIS — R110 Nausea: Secondary | ICD-10-CM | POA: Diagnosis not present

## 2019-01-27 DIAGNOSIS — Z79899 Other long term (current) drug therapy: Secondary | ICD-10-CM | POA: Diagnosis not present

## 2019-01-27 DIAGNOSIS — Z950 Presence of cardiac pacemaker: Secondary | ICD-10-CM | POA: Diagnosis not present

## 2019-01-27 DIAGNOSIS — I480 Paroxysmal atrial fibrillation: Secondary | ICD-10-CM | POA: Diagnosis not present

## 2019-01-27 DIAGNOSIS — D469 Myelodysplastic syndrome, unspecified: Secondary | ICD-10-CM | POA: Diagnosis not present

## 2019-01-27 DIAGNOSIS — E611 Iron deficiency: Secondary | ICD-10-CM | POA: Diagnosis not present

## 2019-01-27 DIAGNOSIS — J329 Chronic sinusitis, unspecified: Secondary | ICD-10-CM | POA: Diagnosis not present

## 2019-01-27 DIAGNOSIS — R05 Cough: Secondary | ICD-10-CM | POA: Diagnosis not present

## 2019-01-27 DIAGNOSIS — L308 Other specified dermatitis: Secondary | ICD-10-CM | POA: Diagnosis not present

## 2019-01-27 DIAGNOSIS — T451X5A Adverse effect of antineoplastic and immunosuppressive drugs, initial encounter: Secondary | ICD-10-CM | POA: Diagnosis not present

## 2019-01-27 DIAGNOSIS — I495 Sick sinus syndrome: Secondary | ICD-10-CM | POA: Diagnosis not present

## 2019-01-27 DIAGNOSIS — D61818 Other pancytopenia: Secondary | ICD-10-CM | POA: Diagnosis not present

## 2019-01-27 DIAGNOSIS — R0602 Shortness of breath: Secondary | ICD-10-CM | POA: Diagnosis not present

## 2019-01-27 DIAGNOSIS — R110 Nausea: Secondary | ICD-10-CM | POA: Diagnosis not present

## 2019-01-27 DIAGNOSIS — K5900 Constipation, unspecified: Secondary | ICD-10-CM | POA: Diagnosis not present

## 2019-01-27 DIAGNOSIS — R918 Other nonspecific abnormal finding of lung field: Secondary | ICD-10-CM | POA: Diagnosis not present

## 2019-01-27 DIAGNOSIS — C9200 Acute myeloblastic leukemia, not having achieved remission: Secondary | ICD-10-CM | POA: Diagnosis not present

## 2019-01-27 DIAGNOSIS — L209 Atopic dermatitis, unspecified: Secondary | ICD-10-CM | POA: Diagnosis not present

## 2019-01-27 DIAGNOSIS — G4700 Insomnia, unspecified: Secondary | ICD-10-CM | POA: Diagnosis not present

## 2019-02-03 DIAGNOSIS — I480 Paroxysmal atrial fibrillation: Secondary | ICD-10-CM | POA: Diagnosis not present

## 2019-02-03 DIAGNOSIS — T451X5D Adverse effect of antineoplastic and immunosuppressive drugs, subsequent encounter: Secondary | ICD-10-CM | POA: Diagnosis not present

## 2019-02-03 DIAGNOSIS — R0602 Shortness of breath: Secondary | ICD-10-CM | POA: Diagnosis not present

## 2019-02-03 DIAGNOSIS — J329 Chronic sinusitis, unspecified: Secondary | ICD-10-CM | POA: Diagnosis not present

## 2019-02-03 DIAGNOSIS — R911 Solitary pulmonary nodule: Secondary | ICD-10-CM | POA: Diagnosis not present

## 2019-02-03 DIAGNOSIS — L209 Atopic dermatitis, unspecified: Secondary | ICD-10-CM | POA: Diagnosis not present

## 2019-02-03 DIAGNOSIS — R05 Cough: Secondary | ICD-10-CM | POA: Diagnosis not present

## 2019-02-03 DIAGNOSIS — G4700 Insomnia, unspecified: Secondary | ICD-10-CM | POA: Diagnosis not present

## 2019-02-03 DIAGNOSIS — R110 Nausea: Secondary | ICD-10-CM | POA: Diagnosis not present

## 2019-02-03 DIAGNOSIS — D469 Myelodysplastic syndrome, unspecified: Secondary | ICD-10-CM | POA: Diagnosis not present

## 2019-02-03 DIAGNOSIS — Z8572 Personal history of non-Hodgkin lymphomas: Secondary | ICD-10-CM | POA: Diagnosis not present

## 2019-02-03 DIAGNOSIS — R600 Localized edema: Secondary | ICD-10-CM | POA: Diagnosis not present

## 2019-02-03 DIAGNOSIS — Z950 Presence of cardiac pacemaker: Secondary | ICD-10-CM | POA: Diagnosis not present

## 2019-02-03 DIAGNOSIS — D696 Thrombocytopenia, unspecified: Secondary | ICD-10-CM | POA: Diagnosis not present

## 2019-02-03 DIAGNOSIS — I495 Sick sinus syndrome: Secondary | ICD-10-CM | POA: Diagnosis not present

## 2019-02-03 DIAGNOSIS — Z79899 Other long term (current) drug therapy: Secondary | ICD-10-CM | POA: Diagnosis not present

## 2019-02-03 DIAGNOSIS — K5900 Constipation, unspecified: Secondary | ICD-10-CM | POA: Diagnosis not present

## 2019-02-03 DIAGNOSIS — C9200 Acute myeloblastic leukemia, not having achieved remission: Secondary | ICD-10-CM | POA: Diagnosis not present

## 2019-02-03 DIAGNOSIS — L308 Other specified dermatitis: Secondary | ICD-10-CM | POA: Diagnosis not present

## 2019-02-05 DIAGNOSIS — R918 Other nonspecific abnormal finding of lung field: Secondary | ICD-10-CM | POA: Diagnosis not present

## 2019-02-05 DIAGNOSIS — B449 Aspergillosis, unspecified: Secondary | ICD-10-CM | POA: Diagnosis not present

## 2019-02-05 DIAGNOSIS — J189 Pneumonia, unspecified organism: Secondary | ICD-10-CM | POA: Diagnosis not present

## 2019-02-10 DIAGNOSIS — B449 Aspergillosis, unspecified: Secondary | ICD-10-CM | POA: Diagnosis not present

## 2019-02-10 DIAGNOSIS — C9200 Acute myeloblastic leukemia, not having achieved remission: Secondary | ICD-10-CM | POA: Diagnosis not present

## 2019-02-16 ENCOUNTER — Ambulatory Visit (INDEPENDENT_AMBULATORY_CARE_PROVIDER_SITE_OTHER): Payer: Medicare Other | Admitting: *Deleted

## 2019-02-16 DIAGNOSIS — I4891 Unspecified atrial fibrillation: Secondary | ICD-10-CM

## 2019-02-17 DIAGNOSIS — C9200 Acute myeloblastic leukemia, not having achieved remission: Secondary | ICD-10-CM | POA: Diagnosis not present

## 2019-02-17 DIAGNOSIS — J328 Other chronic sinusitis: Secondary | ICD-10-CM | POA: Diagnosis not present

## 2019-02-17 DIAGNOSIS — K5903 Drug induced constipation: Secondary | ICD-10-CM | POA: Diagnosis not present

## 2019-02-17 DIAGNOSIS — Z95828 Presence of other vascular implants and grafts: Secondary | ICD-10-CM | POA: Diagnosis not present

## 2019-02-17 DIAGNOSIS — G4700 Insomnia, unspecified: Secondary | ICD-10-CM | POA: Diagnosis not present

## 2019-02-17 LAB — CUP PACEART REMOTE DEVICE CHECK
Battery Remaining Longevity: 19 mo
Battery Voltage: 2.93 V
Brady Statistic AP VP Percent: 62.63 %
Brady Statistic AP VS Percent: 0.01 %
Brady Statistic AS VP Percent: 37.05 %
Brady Statistic AS VS Percent: 0.32 %
Brady Statistic RA Percent Paced: 61.39 %
Brady Statistic RV Percent Paced: 99.67 %
Date Time Interrogation Session: 20200811025238
Implantable Lead Implant Date: 20071115
Implantable Lead Implant Date: 20071115
Implantable Lead Location: 753859
Implantable Lead Location: 753860
Implantable Lead Model: 5076
Implantable Lead Model: 5076
Implantable Pulse Generator Implant Date: 20150508
Lead Channel Impedance Value: 285 Ohm
Lead Channel Impedance Value: 323 Ohm
Lead Channel Impedance Value: 361 Ohm
Lead Channel Impedance Value: 361 Ohm
Lead Channel Pacing Threshold Amplitude: 0.75 V
Lead Channel Pacing Threshold Amplitude: 1.625 V
Lead Channel Pacing Threshold Pulse Width: 0.4 ms
Lead Channel Pacing Threshold Pulse Width: 0.4 ms
Lead Channel Sensing Intrinsic Amplitude: 1.75 mV
Lead Channel Sensing Intrinsic Amplitude: 1.75 mV
Lead Channel Sensing Intrinsic Amplitude: 10.375 mV
Lead Channel Sensing Intrinsic Amplitude: 10.375 mV
Lead Channel Setting Pacing Amplitude: 2 V
Lead Channel Setting Pacing Amplitude: 2.5 V
Lead Channel Setting Pacing Pulse Width: 0.7 ms
Lead Channel Setting Sensing Sensitivity: 2.8 mV

## 2019-02-19 DIAGNOSIS — Z9989 Dependence on other enabling machines and devices: Secondary | ICD-10-CM | POA: Diagnosis not present

## 2019-02-19 DIAGNOSIS — Z7901 Long term (current) use of anticoagulants: Secondary | ICD-10-CM | POA: Diagnosis not present

## 2019-02-19 DIAGNOSIS — J479 Bronchiectasis, uncomplicated: Secondary | ICD-10-CM | POA: Diagnosis not present

## 2019-02-19 DIAGNOSIS — B449 Aspergillosis, unspecified: Secondary | ICD-10-CM | POA: Diagnosis not present

## 2019-02-24 DIAGNOSIS — Z950 Presence of cardiac pacemaker: Secondary | ICD-10-CM | POA: Diagnosis not present

## 2019-02-24 DIAGNOSIS — Z95828 Presence of other vascular implants and grafts: Secondary | ICD-10-CM | POA: Diagnosis not present

## 2019-02-24 DIAGNOSIS — L308 Other specified dermatitis: Secondary | ICD-10-CM | POA: Diagnosis not present

## 2019-02-24 DIAGNOSIS — C9200 Acute myeloblastic leukemia, not having achieved remission: Secondary | ICD-10-CM | POA: Diagnosis not present

## 2019-02-24 DIAGNOSIS — A310 Pulmonary mycobacterial infection: Secondary | ICD-10-CM | POA: Diagnosis not present

## 2019-02-24 DIAGNOSIS — K5903 Drug induced constipation: Secondary | ICD-10-CM | POA: Diagnosis not present

## 2019-02-24 DIAGNOSIS — I495 Sick sinus syndrome: Secondary | ICD-10-CM | POA: Diagnosis not present

## 2019-02-24 DIAGNOSIS — Z87898 Personal history of other specified conditions: Secondary | ICD-10-CM | POA: Diagnosis not present

## 2019-02-24 DIAGNOSIS — G4700 Insomnia, unspecified: Secondary | ICD-10-CM | POA: Diagnosis not present

## 2019-02-24 NOTE — Progress Notes (Signed)
Remote pacemaker transmission.   

## 2019-02-25 ENCOUNTER — Other Ambulatory Visit: Payer: Self-pay | Admitting: Interventional Cardiology

## 2019-02-26 DIAGNOSIS — A310 Pulmonary mycobacterial infection: Secondary | ICD-10-CM | POA: Diagnosis not present

## 2019-02-26 DIAGNOSIS — B449 Aspergillosis, unspecified: Secondary | ICD-10-CM | POA: Diagnosis not present

## 2019-02-26 DIAGNOSIS — A319 Mycobacterial infection, unspecified: Secondary | ICD-10-CM | POA: Diagnosis not present

## 2019-02-26 DIAGNOSIS — J189 Pneumonia, unspecified organism: Secondary | ICD-10-CM | POA: Diagnosis not present

## 2019-02-26 DIAGNOSIS — D899 Disorder involving the immune mechanism, unspecified: Secondary | ICD-10-CM | POA: Diagnosis not present

## 2019-02-26 DIAGNOSIS — C9200 Acute myeloblastic leukemia, not having achieved remission: Secondary | ICD-10-CM | POA: Diagnosis not present

## 2019-02-26 DIAGNOSIS — B488 Other specified mycoses: Secondary | ICD-10-CM | POA: Diagnosis not present

## 2019-03-02 DIAGNOSIS — Z125 Encounter for screening for malignant neoplasm of prostate: Secondary | ICD-10-CM | POA: Diagnosis not present

## 2019-03-02 DIAGNOSIS — R7302 Impaired glucose tolerance (oral): Secondary | ICD-10-CM | POA: Diagnosis not present

## 2019-03-02 DIAGNOSIS — Z23 Encounter for immunization: Secondary | ICD-10-CM | POA: Diagnosis not present

## 2019-03-02 DIAGNOSIS — R82998 Other abnormal findings in urine: Secondary | ICD-10-CM | POA: Diagnosis not present

## 2019-03-02 DIAGNOSIS — E7849 Other hyperlipidemia: Secondary | ICD-10-CM | POA: Diagnosis not present

## 2019-03-03 DIAGNOSIS — L218 Other seborrheic dermatitis: Secondary | ICD-10-CM | POA: Diagnosis not present

## 2019-03-03 DIAGNOSIS — L570 Actinic keratosis: Secondary | ICD-10-CM | POA: Diagnosis not present

## 2019-03-03 DIAGNOSIS — Z85820 Personal history of malignant melanoma of skin: Secondary | ICD-10-CM | POA: Diagnosis not present

## 2019-03-03 DIAGNOSIS — Z85828 Personal history of other malignant neoplasm of skin: Secondary | ICD-10-CM | POA: Diagnosis not present

## 2019-03-03 DIAGNOSIS — L821 Other seborrheic keratosis: Secondary | ICD-10-CM | POA: Diagnosis not present

## 2019-03-03 DIAGNOSIS — D225 Melanocytic nevi of trunk: Secondary | ICD-10-CM | POA: Diagnosis not present

## 2019-03-03 DIAGNOSIS — L814 Other melanin hyperpigmentation: Secondary | ICD-10-CM | POA: Diagnosis not present

## 2019-03-09 ENCOUNTER — Ambulatory Visit: Payer: Medicare Other | Admitting: Cardiology

## 2019-03-09 DIAGNOSIS — D469 Myelodysplastic syndrome, unspecified: Secondary | ICD-10-CM | POA: Diagnosis not present

## 2019-03-09 DIAGNOSIS — E7439 Other disorders of intestinal carbohydrate absorption: Secondary | ICD-10-CM | POA: Diagnosis not present

## 2019-03-09 DIAGNOSIS — C439 Malignant melanoma of skin, unspecified: Secondary | ICD-10-CM | POA: Diagnosis not present

## 2019-03-09 DIAGNOSIS — E785 Hyperlipidemia, unspecified: Secondary | ICD-10-CM | POA: Diagnosis not present

## 2019-03-09 DIAGNOSIS — C8580 Other specified types of non-Hodgkin lymphoma, unspecified site: Secondary | ICD-10-CM | POA: Diagnosis not present

## 2019-03-09 DIAGNOSIS — K648 Other hemorrhoids: Secondary | ICD-10-CM | POA: Diagnosis not present

## 2019-03-09 DIAGNOSIS — G4700 Insomnia, unspecified: Secondary | ICD-10-CM | POA: Diagnosis not present

## 2019-03-09 DIAGNOSIS — I480 Paroxysmal atrial fibrillation: Secondary | ICD-10-CM | POA: Diagnosis not present

## 2019-03-09 DIAGNOSIS — G629 Polyneuropathy, unspecified: Secondary | ICD-10-CM | POA: Diagnosis not present

## 2019-03-09 DIAGNOSIS — C9200 Acute myeloblastic leukemia, not having achieved remission: Secondary | ICD-10-CM | POA: Diagnosis not present

## 2019-03-09 DIAGNOSIS — N401 Enlarged prostate with lower urinary tract symptoms: Secondary | ICD-10-CM | POA: Diagnosis not present

## 2019-03-09 DIAGNOSIS — Z0000 Encounter for general adult medical examination without abnormal findings: Secondary | ICD-10-CM | POA: Diagnosis not present

## 2019-03-10 DIAGNOSIS — Z8579 Personal history of other malignant neoplasms of lymphoid, hematopoietic and related tissues: Secondary | ICD-10-CM | POA: Diagnosis not present

## 2019-03-10 DIAGNOSIS — J189 Pneumonia, unspecified organism: Secondary | ICD-10-CM | POA: Diagnosis not present

## 2019-03-10 DIAGNOSIS — Z7952 Long term (current) use of systemic steroids: Secondary | ICD-10-CM | POA: Diagnosis not present

## 2019-03-10 DIAGNOSIS — Z95828 Presence of other vascular implants and grafts: Secondary | ICD-10-CM | POA: Diagnosis not present

## 2019-03-10 DIAGNOSIS — R110 Nausea: Secondary | ICD-10-CM | POA: Diagnosis not present

## 2019-03-10 DIAGNOSIS — Z7901 Long term (current) use of anticoagulants: Secondary | ICD-10-CM | POA: Diagnosis not present

## 2019-03-10 DIAGNOSIS — Z79899 Other long term (current) drug therapy: Secondary | ICD-10-CM | POA: Diagnosis not present

## 2019-03-10 DIAGNOSIS — K5903 Drug induced constipation: Secondary | ICD-10-CM | POA: Diagnosis not present

## 2019-03-10 DIAGNOSIS — G4700 Insomnia, unspecified: Secondary | ICD-10-CM | POA: Diagnosis not present

## 2019-03-10 DIAGNOSIS — T451X5A Adverse effect of antineoplastic and immunosuppressive drugs, initial encounter: Secondary | ICD-10-CM | POA: Diagnosis not present

## 2019-03-10 DIAGNOSIS — I495 Sick sinus syndrome: Secondary | ICD-10-CM | POA: Diagnosis not present

## 2019-03-10 DIAGNOSIS — C884 Extranodal marginal zone B-cell lymphoma of mucosa-associated lymphoid tissue [MALT-lymphoma]: Secondary | ICD-10-CM | POA: Diagnosis not present

## 2019-03-10 DIAGNOSIS — I480 Paroxysmal atrial fibrillation: Secondary | ICD-10-CM | POA: Diagnosis not present

## 2019-03-10 DIAGNOSIS — R0602 Shortness of breath: Secondary | ICD-10-CM | POA: Diagnosis not present

## 2019-03-10 DIAGNOSIS — C9200 Acute myeloblastic leukemia, not having achieved remission: Secondary | ICD-10-CM | POA: Diagnosis not present

## 2019-03-10 DIAGNOSIS — D473 Essential (hemorrhagic) thrombocythemia: Secondary | ICD-10-CM | POA: Diagnosis not present

## 2019-03-10 DIAGNOSIS — R05 Cough: Secondary | ICD-10-CM | POA: Diagnosis not present

## 2019-03-10 DIAGNOSIS — Z792 Long term (current) use of antibiotics: Secondary | ICD-10-CM | POA: Diagnosis not present

## 2019-03-10 DIAGNOSIS — D469 Myelodysplastic syndrome, unspecified: Secondary | ICD-10-CM | POA: Diagnosis not present

## 2019-03-10 DIAGNOSIS — K5900 Constipation, unspecified: Secondary | ICD-10-CM | POA: Diagnosis not present

## 2019-03-10 DIAGNOSIS — J329 Chronic sinusitis, unspecified: Secondary | ICD-10-CM | POA: Diagnosis not present

## 2019-03-10 DIAGNOSIS — D696 Thrombocytopenia, unspecified: Secondary | ICD-10-CM | POA: Diagnosis not present

## 2019-03-10 DIAGNOSIS — Z950 Presence of cardiac pacemaker: Secondary | ICD-10-CM | POA: Diagnosis not present

## 2019-03-10 DIAGNOSIS — L209 Atopic dermatitis, unspecified: Secondary | ICD-10-CM | POA: Diagnosis not present

## 2019-03-10 DIAGNOSIS — R630 Anorexia: Secondary | ICD-10-CM | POA: Diagnosis not present

## 2019-03-17 DIAGNOSIS — D469 Myelodysplastic syndrome, unspecified: Secondary | ICD-10-CM | POA: Diagnosis not present

## 2019-03-17 DIAGNOSIS — Z95828 Presence of other vascular implants and grafts: Secondary | ICD-10-CM | POA: Diagnosis not present

## 2019-03-17 DIAGNOSIS — I495 Sick sinus syndrome: Secondary | ICD-10-CM | POA: Diagnosis not present

## 2019-03-17 DIAGNOSIS — C9200 Acute myeloblastic leukemia, not having achieved remission: Secondary | ICD-10-CM | POA: Diagnosis not present

## 2019-03-17 DIAGNOSIS — Z79899 Other long term (current) drug therapy: Secondary | ICD-10-CM | POA: Diagnosis not present

## 2019-03-17 DIAGNOSIS — R110 Nausea: Secondary | ICD-10-CM | POA: Diagnosis not present

## 2019-03-17 DIAGNOSIS — K5903 Drug induced constipation: Secondary | ICD-10-CM | POA: Diagnosis not present

## 2019-03-17 DIAGNOSIS — Z7952 Long term (current) use of systemic steroids: Secondary | ICD-10-CM | POA: Diagnosis not present

## 2019-03-17 DIAGNOSIS — T451X5A Adverse effect of antineoplastic and immunosuppressive drugs, initial encounter: Secondary | ICD-10-CM | POA: Diagnosis not present

## 2019-03-17 DIAGNOSIS — Z8619 Personal history of other infectious and parasitic diseases: Secondary | ICD-10-CM | POA: Diagnosis not present

## 2019-03-17 DIAGNOSIS — L209 Atopic dermatitis, unspecified: Secondary | ICD-10-CM | POA: Diagnosis not present

## 2019-03-17 DIAGNOSIS — Z950 Presence of cardiac pacemaker: Secondary | ICD-10-CM | POA: Diagnosis not present

## 2019-03-17 DIAGNOSIS — Z7901 Long term (current) use of anticoagulants: Secondary | ICD-10-CM | POA: Diagnosis not present

## 2019-03-17 DIAGNOSIS — D696 Thrombocytopenia, unspecified: Secondary | ICD-10-CM | POA: Diagnosis not present

## 2019-03-17 DIAGNOSIS — I480 Paroxysmal atrial fibrillation: Secondary | ICD-10-CM | POA: Diagnosis not present

## 2019-03-17 DIAGNOSIS — K5900 Constipation, unspecified: Secondary | ICD-10-CM | POA: Diagnosis not present

## 2019-03-17 DIAGNOSIS — G4700 Insomnia, unspecified: Secondary | ICD-10-CM | POA: Diagnosis not present

## 2019-03-23 IMAGING — CR DG CHEST 2V
2 series · 2 of 2 positions shown · non-contrast
Comparison: 05/27/2006 05/24/2006

CLINICAL DATA: Shortness of breath.  Dyspnea on exertion.

EXAM:
CHEST  2 VIEW

[w chest pa]
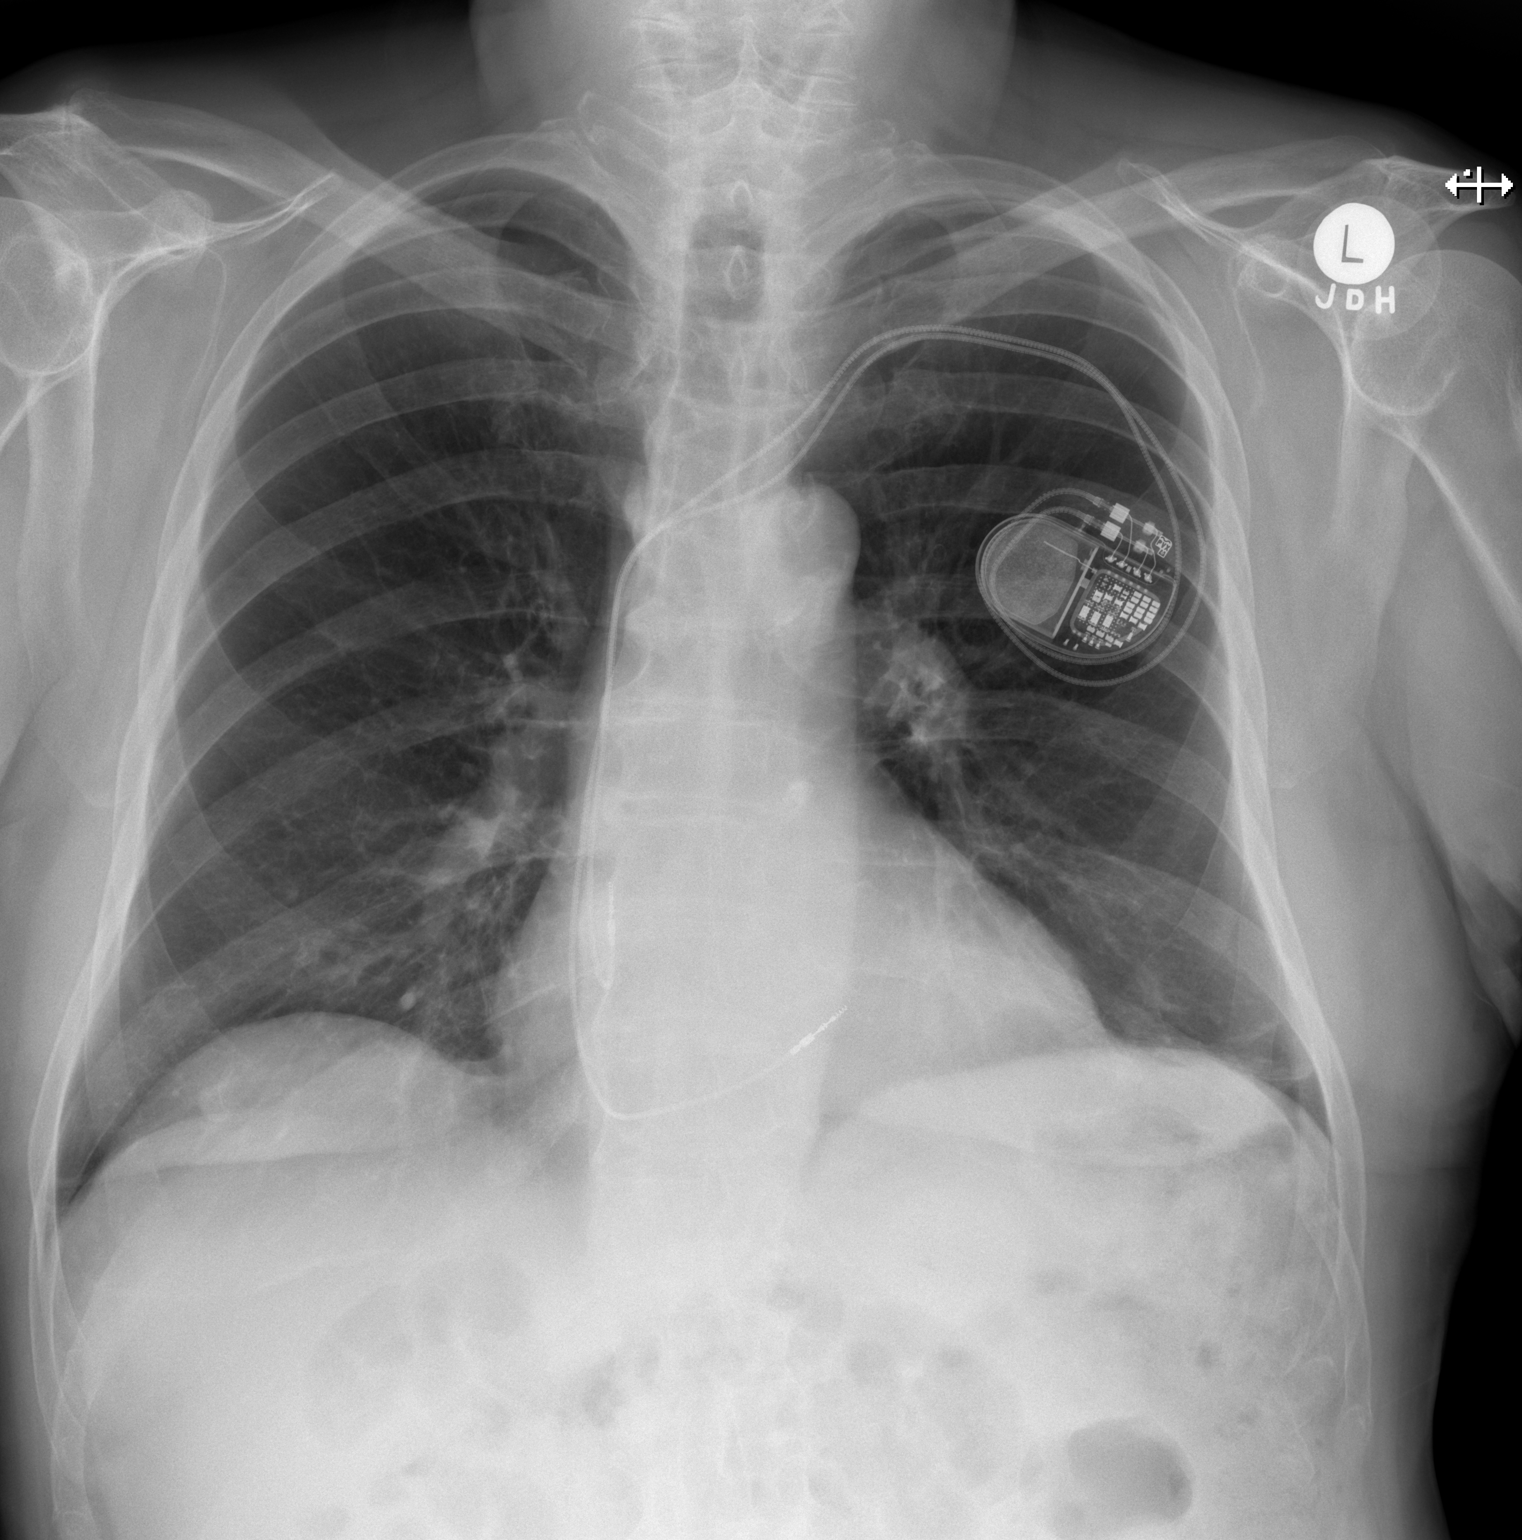

[w chest lat]
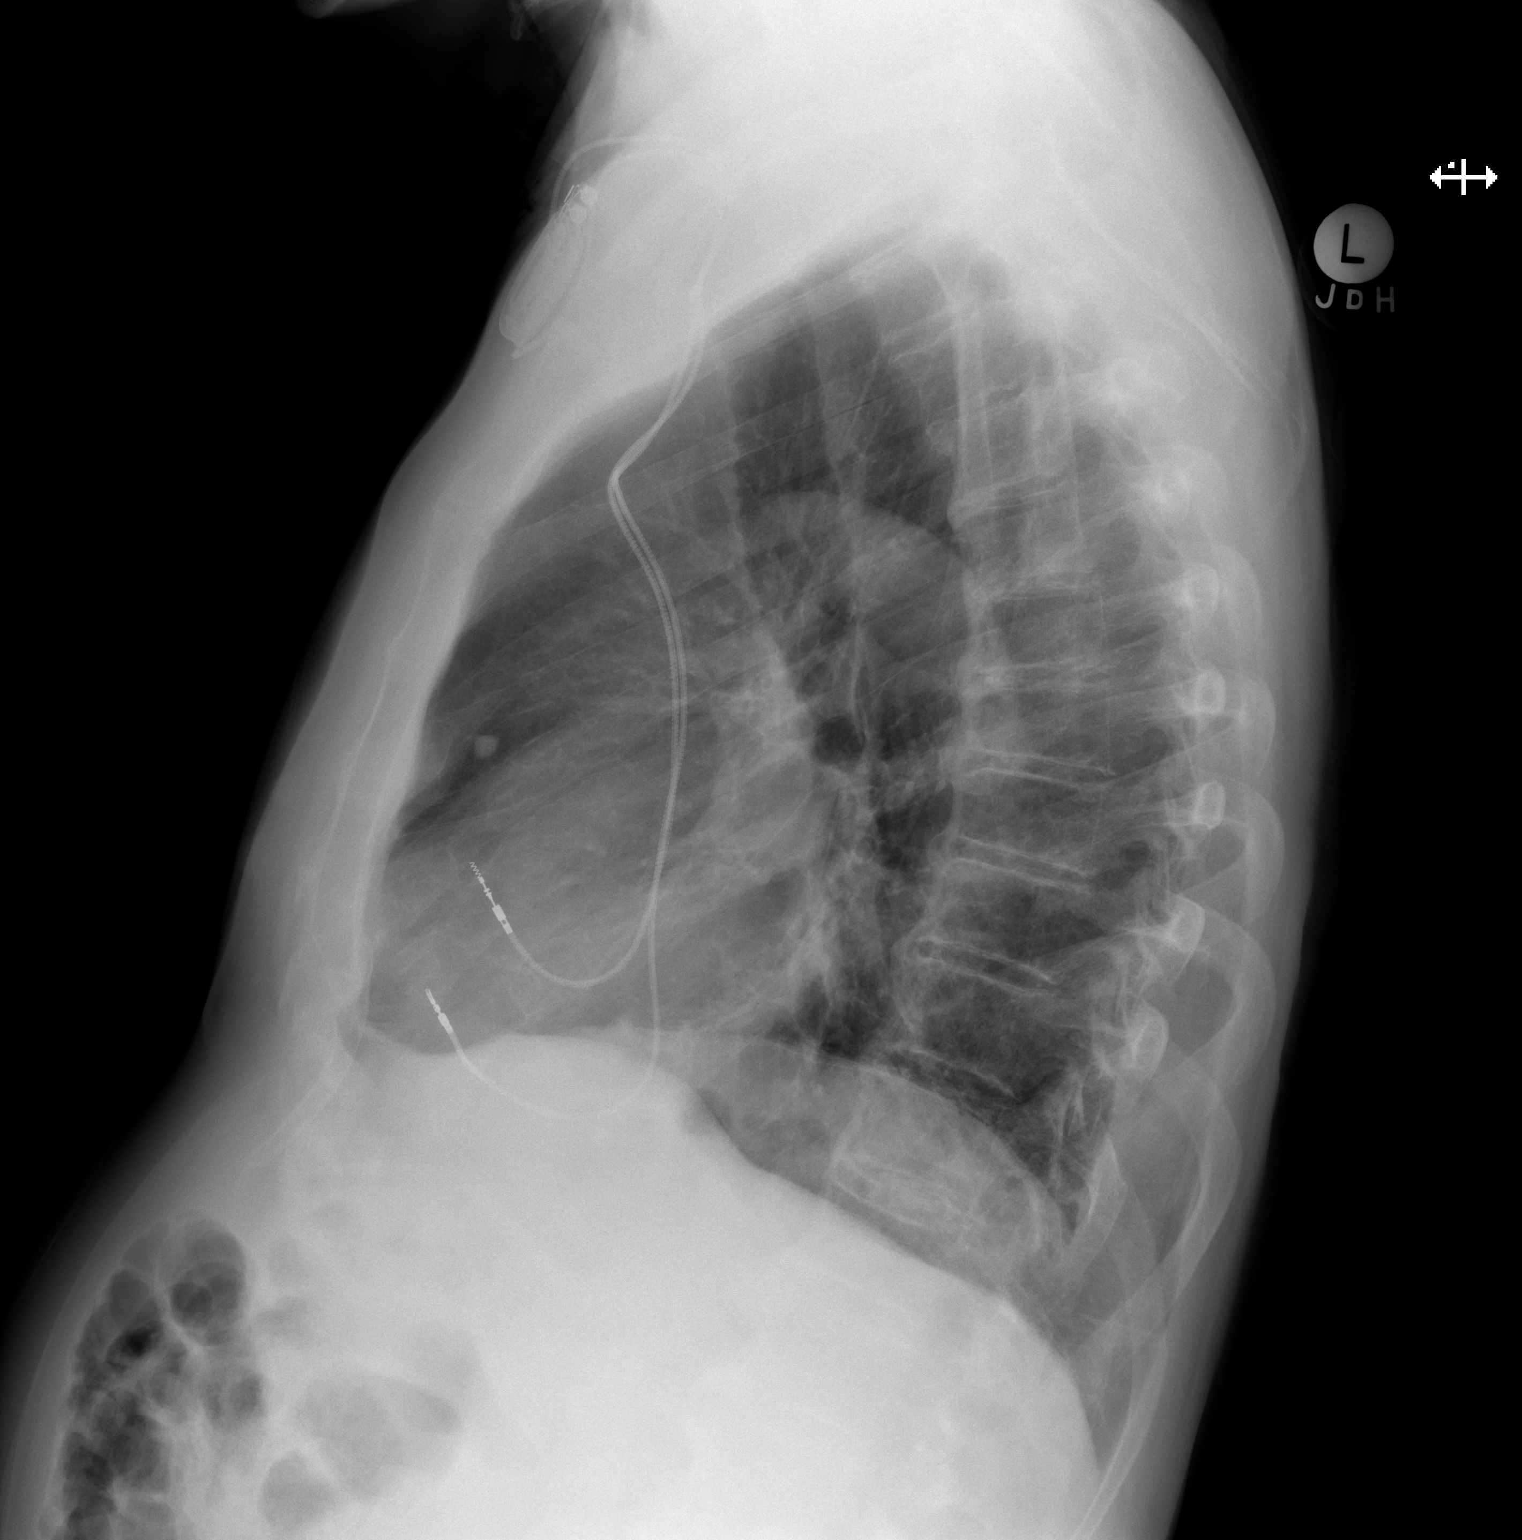

[2 of 2 positions shown; findings below may reference images not displayed]

FINDINGS: The heart size and mediastinal contours are within normal limits.
Dual lead pacemaker in place. Both lungs are clear except for
calcified granuloma in the medial aspect of the lingula and minimal
atelectasis or scarring at the left lung base laterally. The
visualized skeletal structures are unremarkable.
IMPRESSION: Minimal scarring or atelectasis at the left lung base laterally. No
other significant findings.

## 2019-03-24 DIAGNOSIS — C9200 Acute myeloblastic leukemia, not having achieved remission: Secondary | ICD-10-CM | POA: Diagnosis not present

## 2019-03-24 DIAGNOSIS — D696 Thrombocytopenia, unspecified: Secondary | ICD-10-CM | POA: Diagnosis not present

## 2019-03-25 DIAGNOSIS — Z1212 Encounter for screening for malignant neoplasm of rectum: Secondary | ICD-10-CM | POA: Diagnosis not present

## 2019-03-26 DIAGNOSIS — A318 Other mycobacterial infections: Secondary | ICD-10-CM | POA: Diagnosis not present

## 2019-03-26 DIAGNOSIS — C9200 Acute myeloblastic leukemia, not having achieved remission: Secondary | ICD-10-CM | POA: Diagnosis not present

## 2019-03-26 DIAGNOSIS — J189 Pneumonia, unspecified organism: Secondary | ICD-10-CM | POA: Diagnosis not present

## 2019-03-31 DIAGNOSIS — J329 Chronic sinusitis, unspecified: Secondary | ICD-10-CM | POA: Diagnosis not present

## 2019-03-31 DIAGNOSIS — Z792 Long term (current) use of antibiotics: Secondary | ICD-10-CM | POA: Diagnosis not present

## 2019-03-31 DIAGNOSIS — I480 Paroxysmal atrial fibrillation: Secondary | ICD-10-CM | POA: Diagnosis not present

## 2019-03-31 DIAGNOSIS — Z79899 Other long term (current) drug therapy: Secondary | ICD-10-CM | POA: Diagnosis not present

## 2019-03-31 DIAGNOSIS — L209 Atopic dermatitis, unspecified: Secondary | ICD-10-CM | POA: Diagnosis not present

## 2019-03-31 DIAGNOSIS — Z8572 Personal history of non-Hodgkin lymphomas: Secondary | ICD-10-CM | POA: Diagnosis not present

## 2019-03-31 DIAGNOSIS — Z7952 Long term (current) use of systemic steroids: Secondary | ICD-10-CM | POA: Diagnosis not present

## 2019-03-31 DIAGNOSIS — R5383 Other fatigue: Secondary | ICD-10-CM | POA: Diagnosis not present

## 2019-03-31 DIAGNOSIS — C9200 Acute myeloblastic leukemia, not having achieved remission: Secondary | ICD-10-CM | POA: Diagnosis not present

## 2019-03-31 DIAGNOSIS — R110 Nausea: Secondary | ICD-10-CM | POA: Diagnosis not present

## 2019-03-31 DIAGNOSIS — Z950 Presence of cardiac pacemaker: Secondary | ICD-10-CM | POA: Diagnosis not present

## 2019-03-31 DIAGNOSIS — G4700 Insomnia, unspecified: Secondary | ICD-10-CM | POA: Diagnosis not present

## 2019-03-31 DIAGNOSIS — T451X5A Adverse effect of antineoplastic and immunosuppressive drugs, initial encounter: Secondary | ICD-10-CM | POA: Diagnosis not present

## 2019-03-31 DIAGNOSIS — D469 Myelodysplastic syndrome, unspecified: Secondary | ICD-10-CM | POA: Diagnosis not present

## 2019-03-31 DIAGNOSIS — D696 Thrombocytopenia, unspecified: Secondary | ICD-10-CM | POA: Diagnosis not present

## 2019-03-31 DIAGNOSIS — R05 Cough: Secondary | ICD-10-CM | POA: Diagnosis not present

## 2019-03-31 DIAGNOSIS — K5903 Drug induced constipation: Secondary | ICD-10-CM | POA: Diagnosis not present

## 2019-03-31 DIAGNOSIS — Z959 Presence of cardiac and vascular implant and graft, unspecified: Secondary | ICD-10-CM | POA: Diagnosis not present

## 2019-04-07 DIAGNOSIS — Z79899 Other long term (current) drug therapy: Secondary | ICD-10-CM | POA: Diagnosis not present

## 2019-04-07 DIAGNOSIS — Z950 Presence of cardiac pacemaker: Secondary | ICD-10-CM | POA: Diagnosis not present

## 2019-04-07 DIAGNOSIS — D696 Thrombocytopenia, unspecified: Secondary | ICD-10-CM | POA: Diagnosis not present

## 2019-04-07 DIAGNOSIS — I480 Paroxysmal atrial fibrillation: Secondary | ICD-10-CM | POA: Diagnosis not present

## 2019-04-07 DIAGNOSIS — R413 Other amnesia: Secondary | ICD-10-CM | POA: Diagnosis not present

## 2019-04-07 DIAGNOSIS — R233 Spontaneous ecchymoses: Secondary | ICD-10-CM | POA: Diagnosis not present

## 2019-04-07 DIAGNOSIS — Z8579 Personal history of other malignant neoplasms of lymphoid, hematopoietic and related tissues: Secondary | ICD-10-CM | POA: Diagnosis not present

## 2019-04-07 DIAGNOSIS — K5903 Drug induced constipation: Secondary | ICD-10-CM | POA: Diagnosis not present

## 2019-04-07 DIAGNOSIS — R351 Nocturia: Secondary | ICD-10-CM | POA: Diagnosis not present

## 2019-04-07 DIAGNOSIS — D469 Myelodysplastic syndrome, unspecified: Secondary | ICD-10-CM | POA: Diagnosis not present

## 2019-04-07 DIAGNOSIS — R110 Nausea: Secondary | ICD-10-CM | POA: Diagnosis not present

## 2019-04-07 DIAGNOSIS — G4709 Other insomnia: Secondary | ICD-10-CM | POA: Diagnosis not present

## 2019-04-07 DIAGNOSIS — G4700 Insomnia, unspecified: Secondary | ICD-10-CM | POA: Diagnosis not present

## 2019-04-07 DIAGNOSIS — Z792 Long term (current) use of antibiotics: Secondary | ICD-10-CM | POA: Diagnosis not present

## 2019-04-07 DIAGNOSIS — T451X5A Adverse effect of antineoplastic and immunosuppressive drugs, initial encounter: Secondary | ICD-10-CM | POA: Diagnosis not present

## 2019-04-07 DIAGNOSIS — I495 Sick sinus syndrome: Secondary | ICD-10-CM | POA: Diagnosis not present

## 2019-04-07 DIAGNOSIS — L209 Atopic dermatitis, unspecified: Secondary | ICD-10-CM | POA: Diagnosis not present

## 2019-04-07 DIAGNOSIS — J329 Chronic sinusitis, unspecified: Secondary | ICD-10-CM | POA: Diagnosis not present

## 2019-04-07 DIAGNOSIS — C9200 Acute myeloblastic leukemia, not having achieved remission: Secondary | ICD-10-CM | POA: Diagnosis not present

## 2019-04-07 DIAGNOSIS — Z7952 Long term (current) use of systemic steroids: Secondary | ICD-10-CM | POA: Diagnosis not present

## 2019-04-07 DIAGNOSIS — Z95828 Presence of other vascular implants and grafts: Secondary | ICD-10-CM | POA: Diagnosis not present

## 2019-04-10 DIAGNOSIS — C9200 Acute myeloblastic leukemia, not having achieved remission: Secondary | ICD-10-CM | POA: Diagnosis not present

## 2019-04-10 DIAGNOSIS — D469 Myelodysplastic syndrome, unspecified: Secondary | ICD-10-CM | POA: Diagnosis not present

## 2019-04-10 DIAGNOSIS — Z79899 Other long term (current) drug therapy: Secondary | ICD-10-CM | POA: Diagnosis not present

## 2019-04-14 DIAGNOSIS — D801 Nonfamilial hypogammaglobulinemia: Secondary | ICD-10-CM | POA: Diagnosis not present

## 2019-04-14 DIAGNOSIS — Z950 Presence of cardiac pacemaker: Secondary | ICD-10-CM | POA: Diagnosis not present

## 2019-04-14 DIAGNOSIS — Z87898 Personal history of other specified conditions: Secondary | ICD-10-CM | POA: Diagnosis not present

## 2019-04-14 DIAGNOSIS — I495 Sick sinus syndrome: Secondary | ICD-10-CM | POA: Diagnosis not present

## 2019-04-14 DIAGNOSIS — Z7952 Long term (current) use of systemic steroids: Secondary | ICD-10-CM | POA: Diagnosis not present

## 2019-04-14 DIAGNOSIS — D696 Thrombocytopenia, unspecified: Secondary | ICD-10-CM | POA: Diagnosis not present

## 2019-04-14 DIAGNOSIS — J329 Chronic sinusitis, unspecified: Secondary | ICD-10-CM | POA: Diagnosis not present

## 2019-04-14 DIAGNOSIS — Z8572 Personal history of non-Hodgkin lymphomas: Secondary | ICD-10-CM | POA: Diagnosis not present

## 2019-04-14 DIAGNOSIS — D469 Myelodysplastic syndrome, unspecified: Secondary | ICD-10-CM | POA: Diagnosis not present

## 2019-04-14 DIAGNOSIS — Z79899 Other long term (current) drug therapy: Secondary | ICD-10-CM | POA: Diagnosis not present

## 2019-04-14 DIAGNOSIS — Z792 Long term (current) use of antibiotics: Secondary | ICD-10-CM | POA: Diagnosis not present

## 2019-04-14 DIAGNOSIS — G4700 Insomnia, unspecified: Secondary | ICD-10-CM | POA: Diagnosis not present

## 2019-04-14 DIAGNOSIS — M25561 Pain in right knee: Secondary | ICD-10-CM | POA: Diagnosis not present

## 2019-04-14 DIAGNOSIS — R05 Cough: Secondary | ICD-10-CM | POA: Diagnosis not present

## 2019-04-14 DIAGNOSIS — L308 Other specified dermatitis: Secondary | ICD-10-CM | POA: Diagnosis not present

## 2019-04-14 DIAGNOSIS — I480 Paroxysmal atrial fibrillation: Secondary | ICD-10-CM | POA: Diagnosis not present

## 2019-04-17 DIAGNOSIS — C9200 Acute myeloblastic leukemia, not having achieved remission: Secondary | ICD-10-CM | POA: Diagnosis not present

## 2019-04-17 DIAGNOSIS — D469 Myelodysplastic syndrome, unspecified: Secondary | ICD-10-CM | POA: Diagnosis not present

## 2019-04-17 DIAGNOSIS — Z79899 Other long term (current) drug therapy: Secondary | ICD-10-CM | POA: Diagnosis not present

## 2019-04-21 DIAGNOSIS — G4700 Insomnia, unspecified: Secondary | ICD-10-CM | POA: Diagnosis not present

## 2019-04-21 DIAGNOSIS — Z79899 Other long term (current) drug therapy: Secondary | ICD-10-CM | POA: Diagnosis not present

## 2019-04-21 DIAGNOSIS — M1711 Unilateral primary osteoarthritis, right knee: Secondary | ICD-10-CM | POA: Diagnosis not present

## 2019-04-21 DIAGNOSIS — Z8572 Personal history of non-Hodgkin lymphomas: Secondary | ICD-10-CM | POA: Diagnosis not present

## 2019-04-21 DIAGNOSIS — D473 Essential (hemorrhagic) thrombocythemia: Secondary | ICD-10-CM | POA: Diagnosis not present

## 2019-04-21 DIAGNOSIS — C9200 Acute myeloblastic leukemia, not having achieved remission: Secondary | ICD-10-CM | POA: Diagnosis not present

## 2019-04-21 DIAGNOSIS — Z9689 Presence of other specified functional implants: Secondary | ICD-10-CM | POA: Diagnosis not present

## 2019-04-21 DIAGNOSIS — I480 Paroxysmal atrial fibrillation: Secondary | ICD-10-CM | POA: Diagnosis not present

## 2019-04-21 DIAGNOSIS — Z9889 Other specified postprocedural states: Secondary | ICD-10-CM | POA: Diagnosis not present

## 2019-04-21 DIAGNOSIS — M25561 Pain in right knee: Secondary | ICD-10-CM | POA: Diagnosis not present

## 2019-04-21 DIAGNOSIS — I495 Sick sinus syndrome: Secondary | ICD-10-CM | POA: Diagnosis not present

## 2019-04-21 DIAGNOSIS — D801 Nonfamilial hypogammaglobulinemia: Secondary | ICD-10-CM | POA: Diagnosis not present

## 2019-04-21 DIAGNOSIS — R609 Edema, unspecified: Secondary | ICD-10-CM | POA: Diagnosis not present

## 2019-04-21 DIAGNOSIS — D469 Myelodysplastic syndrome, unspecified: Secondary | ICD-10-CM | POA: Diagnosis not present

## 2019-04-21 DIAGNOSIS — L209 Atopic dermatitis, unspecified: Secondary | ICD-10-CM | POA: Diagnosis not present

## 2019-04-24 DIAGNOSIS — D696 Thrombocytopenia, unspecified: Secondary | ICD-10-CM | POA: Diagnosis not present

## 2019-04-24 DIAGNOSIS — D61818 Other pancytopenia: Secondary | ICD-10-CM | POA: Diagnosis not present

## 2019-04-24 DIAGNOSIS — C9200 Acute myeloblastic leukemia, not having achieved remission: Secondary | ICD-10-CM | POA: Diagnosis not present

## 2019-04-24 DIAGNOSIS — M25561 Pain in right knee: Secondary | ICD-10-CM | POA: Diagnosis not present

## 2019-04-24 DIAGNOSIS — Z79899 Other long term (current) drug therapy: Secondary | ICD-10-CM | POA: Diagnosis not present

## 2019-04-24 DIAGNOSIS — M79604 Pain in right leg: Secondary | ICD-10-CM | POA: Diagnosis not present

## 2019-04-25 DIAGNOSIS — I499 Cardiac arrhythmia, unspecified: Secondary | ICD-10-CM | POA: Diagnosis not present

## 2019-04-25 DIAGNOSIS — R404 Transient alteration of awareness: Secondary | ICD-10-CM | POA: Diagnosis not present

## 2019-04-25 DIAGNOSIS — I469 Cardiac arrest, cause unspecified: Secondary | ICD-10-CM | POA: Diagnosis not present

## 2019-05-10 DIAGNOSIS — 419620001 Death: Secondary | SNOMED CT | POA: Diagnosis not present

## 2019-05-10 DEATH — deceased

## 2019-05-20 ENCOUNTER — Ambulatory Visit: Payer: Medicare Other | Admitting: Interventional Cardiology
